# Patient Record
Sex: Female | Born: 1975 | Race: White | Hispanic: No | Marital: Married | Smoking: Former smoker
Health system: Southern US, Community
[De-identification: ages and names within clinical notes are randomized; demographics above are authoritative.]

## PROBLEM LIST (undated history)

## (undated) DIAGNOSIS — B009 Herpesviral infection, unspecified: Secondary | ICD-10-CM

## (undated) DIAGNOSIS — D259 Leiomyoma of uterus, unspecified: Secondary | ICD-10-CM

## (undated) DIAGNOSIS — F32A Depression, unspecified: Secondary | ICD-10-CM

## (undated) DIAGNOSIS — Z889 Allergy status to unspecified drugs, medicaments and biological substances status: Secondary | ICD-10-CM

## (undated) DIAGNOSIS — F329 Major depressive disorder, single episode, unspecified: Secondary | ICD-10-CM

## (undated) DIAGNOSIS — F319 Bipolar disorder, unspecified: Secondary | ICD-10-CM

## (undated) DIAGNOSIS — N879 Dysplasia of cervix uteri, unspecified: Secondary | ICD-10-CM

## (undated) DIAGNOSIS — J40 Bronchitis, not specified as acute or chronic: Secondary | ICD-10-CM

## (undated) HISTORY — DX: Dysplasia of cervix uteri, unspecified: N87.9

## (undated) HISTORY — DX: Herpesviral infection, unspecified: B00.9

## (undated) HISTORY — DX: Leiomyoma of uterus, unspecified: D25.9

## (undated) HISTORY — PX: LEEP: SHX91

## (undated) HISTORY — PX: MYOMECTOMY: SHX85

---

## 2000-06-15 ENCOUNTER — Emergency Department (HOSPITAL_COMMUNITY): Admission: EM | Admit: 2000-06-15 | Discharge: 2000-06-15 | Payer: Self-pay | Admitting: Emergency Medicine

## 2002-08-31 ENCOUNTER — Other Ambulatory Visit: Admission: RE | Admit: 2002-08-31 | Discharge: 2002-08-31 | Payer: Self-pay | Admitting: Obstetrics and Gynecology

## 2002-09-10 ENCOUNTER — Emergency Department (HOSPITAL_COMMUNITY): Admission: EM | Admit: 2002-09-10 | Discharge: 2002-09-10 | Payer: Self-pay | Admitting: Emergency Medicine

## 2005-10-26 ENCOUNTER — Ambulatory Visit (HOSPITAL_COMMUNITY): Admission: RE | Admit: 2005-10-26 | Discharge: 2005-10-26 | Payer: Self-pay | Admitting: Family Medicine

## 2005-10-27 ENCOUNTER — Ambulatory Visit (HOSPITAL_COMMUNITY): Admission: RE | Admit: 2005-10-27 | Discharge: 2005-10-27 | Payer: Self-pay | Admitting: *Deleted

## 2005-10-27 ENCOUNTER — Other Ambulatory Visit: Admission: RE | Admit: 2005-10-27 | Discharge: 2005-10-27 | Payer: Self-pay | Admitting: Gynecologic Oncology

## 2005-10-27 ENCOUNTER — Ambulatory Visit: Admission: RE | Admit: 2005-10-27 | Discharge: 2005-10-27 | Payer: Self-pay | Admitting: Gynecologic Oncology

## 2005-10-27 ENCOUNTER — Encounter (INDEPENDENT_AMBULATORY_CARE_PROVIDER_SITE_OTHER): Payer: Self-pay | Admitting: Specialist

## 2005-12-16 ENCOUNTER — Ambulatory Visit: Admission: RE | Admit: 2005-12-16 | Discharge: 2005-12-16 | Payer: Self-pay | Admitting: Gynecologic Oncology

## 2008-12-20 ENCOUNTER — Encounter: Admission: RE | Admit: 2008-12-20 | Discharge: 2008-12-20 | Payer: Self-pay | Admitting: *Deleted

## 2008-12-20 ENCOUNTER — Encounter: Admission: RE | Admit: 2008-12-20 | Discharge: 2008-12-20 | Payer: Self-pay | Admitting: Family Medicine

## 2010-10-03 NOTE — Consult Note (Signed)
NAME:  Claudia Berger, Claudia Berger NO.:  [id number]   MEDICAL RECORD NO.:  [id number]          PATIENT TYPE:  OUT   LOCATION:  GYN                          FACILITY:  [facility]   PHYSICIAN:  Paola A. Duard Brady, MD    DATE OF BIRTH:  10-03-1975   DATE OF CONSULTATION:  12/16/2005  DATE OF DISCHARGE:                                   CONSULTATION   Claudia Berger is a 35 year old, gravida 0 who was referred to me based on a  large abdominal pelvic mass.  She went to Urgent Care, a urine culture was  obtained when she had pain for about 3 months. The symptoms did not improve  and she subsequently underwent a CT scan.  CT scan revealed a large mixed  solid and cystic pelvic mass measuring 19.5 x 11.7 x 17.6 cm.  It was felt  to be most likely ovarian in etiology. On November 12, 2005, she underwent  exploratory laparotomy at [facility]. The operative findings included a  15 cm broad ligament and uterine myxoid cystic mass. 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 She also underwent a D&C.  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 There was no significant atypia or tumor cell  necrosis. The tumor was ER/PR positive. The endometrial curettings were  negative.  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  Close follow-up was recommended.  She comes in today for her postoperative  check.  She does have her first period since the procedure. She had slightly  increasing cramping compared to presurgery. The blood was a little darker  but otherwise she is doing well.  She wants to start working out again, is  going back to work next Wednesday and is feeling well.   PHYSICAL EXAMINATION:  VITAL SIGNS:  Weight is 116 pounds.  GENERAL:  Well-nourished, well-developed female in no acute distress.  ABDOMEN:  Shows a well-healed vertical skin incision.  Abdomen is soft,  nontender,  nondistended.  There are no palpable masses or  hepatosplenomegaly.  PELVIC:  External genitalia is within normal limits. On bimanual  examination, the corpus is normal size, shape and consistency, there are no  adnexal masses.   ASSESSMENT:  29.  A 35 year old status post essentially a myomectomy for a large      mitotically active myoma who is doing well postoperatively.  She      understands that this is not a malignant process, but it could recur.      She also understands the possibility of additional fibroids in the      future; however, at this time I would not recommend any more than just      routine GYN care.  She is going to be seeking GYN providers with [facility] and she was given records to take to them and she has asked      that we forward her note from today to them. She knows she does not need  GYN oncology follow-up but we will be happy to see her in the future      should the need arise.  2.  I also discussed with her that based on the myomectomy and the depth      that we were into the endomyometrium that she will require cesarean      section should she opt to having children in the future.  Her questions      were elicited and answered to her satisfaction and she will return on s      p.r.n. basis.      Paola A. Duard Brady, MD  Electronically Signed     PAG/MEDQ  D:  12/16/2005  T:  12/16/2005  Job:  [id number]

## 2010-10-03 NOTE — Consult Note (Signed)
NAME:  HAELYN, FORGEY NO.:  [id number]   MEDICAL RECORD NO.:  [id number]          PATIENT TYPE:  OUT   LOCATION:  CATS                          FACILITY:  [facility]   PHYSICIAN:  Paola A. Duard Brady, MD    DATE OF BIRTH:  1976/03/28   DATE OF CONSULTATION:  10/27/2005  DATE OF DISCHARGE:                                   CONSULTATION   REFERRING PHYSICIAN:  Pam Drown, M.D.,  [facility],  [facility].   The patient is seen today in consultation at the request of Dr. Corliss Blacker.  Ms. Voit is a very pleasant 35 year old gravida 0 whose last cycle was  June 1 of the year 2007, and her cycles have been regular. Approximately 3  months ago, she had an episode of some right-sided sharp pain. It lasted for  about three to four days.  She went to an Urgent Care. A urine culture was  performed that was negative.  She began complaining of increasing frequency  and urgency, no change in her bowel or bladder habits otherwise, and she was  seen by her primary physician.  She an ultrasound yesterday that showed  questionable right hydronephrosis as well as a pelvic mass.  She  subsequently was referred for a CT scan which she underwent today. Despite  multiple attempts, we have not been able to get the reading on that.  She  states that she does feel like she has gained weight.  Her clothes are  fitting tighter than they have been. She denies any chest pain, shortness of  breath, nausea, vomiting, fevers, chills, headaches or visual changes.   PAST MEDICAL HISTORY:  Significant for cervical dysplasia, anxiety disorder  with depression, and questionable bipolar disease.   PAST SURGICAL HISTORY:  Wisdom teeth extraction and LEEP.   MEDICATIONS:  None.   ALLERGIES:  PENICILLIN which causes hives and SULFA which causes hives.   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 She is single.   FAMILY HISTORY:  This is  significant for diabetes in her father and maternal  grandfather and maternal grandmother. Her father had colon cancer at age of  95.  She is an only child.   PAST GYN HISTORY:  She underwent a LEEP in September 2006.  She has not had  a followup Pap smear since that time.  She is sexually active and denies any  STDs.  She had 15 lifetime partners.   PHYSICAL EXAMINATION:  VITAL SIGNS: Weight 123 pounds, height 5 feet 3  inches. Blood pressure 118/68.  GENERAL: Well-nourished, well-developed female in no acute distress.  NECK:  Supple.  There is no lymphadenopathy, no thyromegaly.  LUNGS: Were clear to auscultation bilaterally.  CARDIOVASCULAR:  Regular rate and rhythm.  ABDOMEN:  She has an umbilical ring.  There is a palpable mass four  fingerbreadths above the umbilicus.  Abdomen is soft, nontender,  nondistended.  Palpable masses above.  Groins are negative for adenopathy.  EXTREMITIES:  No edema.  PELVIC:  External genitalia is within normal limits. The vagina  is well  epithelialized. The cervix is severely deviated over to her left side.  The  anterior lip of the cervix is visualized and is normal in appearance.  Thin  prep Pap was submitted without difficulty.  Bimanual examination: She has a  large abdominal pelvic mass measuring approximately 25 cm.  It is mobile.  The rectal mucosa is smooth.  I cannot separate or delineate which ovary it  is coming from or delineate the size of the corpus.   ASSESSMENT:  35 year old with history of cervical dysplasia who has got a  large abdominal pelvic mass.  Per her description of the ultrasound report,  it appears that it is a simple cyst.  However, we will need to follow up on  the results of her CT scan.  I discussed with her that, should this be  completely cystic, we may be able to consider laparoscopic or minimally  invasive surgery.  Otherwise, she will require a laparotomy.  Will obtain CA-  125, HCG, LDH, AFP, tumor markers  today.  The patient is scheduled for  surgery at [facility] on June 28.  Risks and benefits of surgery were  discussed with her. She understands that all attempts will be made to  proceed with fertility-sparing surgery.  We will call her on her cell phone  at [phone].  Of note, her mother does not know that she has undergone a  LEEP or had cervical dysplasia in the past. Her mother also does not know  that the patient smokes, and she would like to keep these issues  confidential.  She has my card. Her questions were answered and elicited to  her satisfaction.      Paola A. Duard Brady, MD  Electronically Signed     PAG/MEDQ  D:  10/27/2005  T:  10/27/2005  Job:  [id number]

## 2013-05-31 ENCOUNTER — Other Ambulatory Visit: Payer: Self-pay | Admitting: Family Medicine

## 2014-05-18 DIAGNOSIS — J40 Bronchitis, not specified as acute or chronic: Secondary | ICD-10-CM

## 2014-05-18 HISTORY — DX: Bronchitis, not specified as acute or chronic: J40

## 2014-06-01 ENCOUNTER — Encounter: Payer: Self-pay | Admitting: Gynecologic Oncology

## 2014-06-04 ENCOUNTER — Encounter: Payer: Self-pay | Admitting: Gynecologic Oncology

## 2014-06-04 ENCOUNTER — Ambulatory Visit: Payer: BLUE CROSS/BLUE SHIELD | Attending: Gynecologic Oncology | Admitting: Gynecologic Oncology

## 2014-06-04 VITALS — BP 138/87 | HR 86 | Temp 98.9°F | Resp 20 | Ht 63.0 in | Wt 141.8 lb

## 2014-06-04 DIAGNOSIS — N9489 Other specified conditions associated with female genital organs and menstrual cycle: Secondary | ICD-10-CM

## 2014-06-04 DIAGNOSIS — B009 Herpesviral infection, unspecified: Secondary | ICD-10-CM | POA: Insufficient documentation

## 2014-06-04 DIAGNOSIS — Z87891 Personal history of nicotine dependence: Secondary | ICD-10-CM | POA: Diagnosis not present

## 2014-06-04 DIAGNOSIS — N879 Dysplasia of cervix uteri, unspecified: Secondary | ICD-10-CM | POA: Diagnosis not present

## 2014-06-04 DIAGNOSIS — D259 Leiomyoma of uterus, unspecified: Secondary | ICD-10-CM | POA: Diagnosis not present

## 2014-06-04 DIAGNOSIS — D390 Neoplasm of uncertain behavior of uterus: Secondary | ICD-10-CM | POA: Insufficient documentation

## 2014-06-04 DIAGNOSIS — Z8742 Personal history of other diseases of the female genital tract: Secondary | ICD-10-CM

## 2014-06-04 DIAGNOSIS — N858 Other specified noninflammatory disorders of uterus: Secondary | ICD-10-CM | POA: Insufficient documentation

## 2014-06-04 NOTE — Patient Instructions (Addendum)
Preparing for your Surgery  Plan for surgery on Feb 9 with Dr. Denman George.  Pre-operative Testing -You will receive a phone call from presurgical testing at [facility] to arrange for a pre-operative testing appointment before your surgery.  This appointment normally occurs one to two weeks before your scheduled surgery.   -Bring your insurance card, copy of an advanced directive if applicable, medication list  -At that visit, you will be asked to sign a consent for a possible blood transfusion in case a transfusion becomes necessary during surgery.  The need for a blood transfusion is rare but having consent is a necessary part of your care.     -You should not be taking blood thinners or aspirin at least ten days prior to surgery unless instructed by your surgeon.  Day Before Surgery at [facility] will be asked to take in only clear liquids the day before surgery.  Examples of clear liquids include broths, jello, and clear juices.  You will be advised to have nothing to eat or drink after midnight the evening before.    Your role in recovery Your role is to become active as soon as directed by your doctor, while still giving yourself time to heal.  Rest when you feel tired. You will be asked to do the following in order to speed your recovery:  - Cough and breathe deeply. This helps toclear and expand your lungs and can prevent pneumonia. You may be given a spirometer to practice deep breathing. A staff member will show you how to use the spirometer. - Do mild physical activity. Walking or moving your legs help your circulation and body functions return to normal. A staff member will help you when you try to walk and will provide you with simple exercises. Do not try to get up or walk alone the first time. - Actively manage your pain. Managing your pain lets you move in comfort. We will ask you to rate your pain on a scale of zero to 10. It is your responsibility to tell your  doctor or nurse where and how much you hurt so your pain can be treated.  Special Considerations -If you are diabetic, you may be placed on insulin after surgery to have closer control over your blood sugars to promote healing and recovery.  This does not mean that you will be discharged on insulin.  If applicable, your oral antidiabetics will be resumed when you are tolerating a solid diet.  -Your final pathology results from surgery should be available by the Friday after surgery and the results will be relayed to you when available.  Blood Transfusion Information WHAT IS A BLOOD TRANSFUSION? A transfusion is the replacement of blood or some of its parts. Blood is made up of multiple cells which provide different functions.  Red blood cells carry oxygen and are used for blood loss replacement.  White blood cells fight against infection.  Platelets control bleeding.  Plasma helps clot blood.  Other blood products are available for specialized needs, such as hemophilia or other clotting disorders. BEFORE THE TRANSFUSION  Who gives blood for transfusions?   You may be able to donate blood to be used at a later date on yourself (autologous donation).  Relatives can be asked to donate blood. This is generally not any safer than if you have received blood from a stranger. The same precautions are taken to ensure safety when a relative's blood is donated.  Healthy volunteers who are fully  evaluated to make sure their blood is safe. This is blood bank blood. Transfusion therapy is the safest it has ever been in the practice of medicine. Before blood is taken from a donor, a complete history is taken to make sure that person has no history of diseases nor engages in risky social behavior (examples are intravenous drug use or sexual activity with multiple partners). The donor's travel history is screened to minimize risk of transmitting infections, such as malaria. The donated blood is tested for  signs of infectious diseases, such as HIV and hepatitis. The blood is then tested to be sure it is compatible with you in order to minimize the chance of a transfusion reaction. If you or a relative donates blood, this is often done in anticipation of surgery and is not appropriate for emergency situations. It takes many days to process the donated blood. RISKS AND COMPLICATIONS Although transfusion therapy is very safe and saves many lives, the main dangers of transfusion include:   Getting an infectious disease.  Developing a transfusion reaction. This is an allergic reaction to something in the blood you were given. Every precaution is taken to prevent this. The decision to have a blood transfusion has been considered carefully by your caregiver before blood is given. Blood is not given unless the benefits outweigh the risks.

## 2014-06-04 NOTE — Progress Notes (Signed)
Consult Note: Gyn-Onc  Consult was requested by Dr. Jobe Igo for the evaluation of Claudia Berger 39 y.o. female with a uterine mass  CC:  Chief Complaint  Patient presents with  . uterine mass    Assessment/Plan:  Ms. Claudia Berger  is a 39 y.o.  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 The diagnosis of mitotically active myoma versus sarcoma can only be made with definitive pathology, and not on imaging alone and the patient understands this potential diagnosis. It is for this reason that I am recommending surgical excision with an exploratory laparotomy myomectomy and possible hysterectomy. The ovaries appear normal and uninvolved on imaging. I discussed that even if sarcoma is identified ovarian preservation is reasonable. I discussed that frozen section we performed of the mass is sarcoma is identified at the time of surgery I will perform a definitive total hysterectomy. However, because the patient desires the potential for future childbearing, if frozen section reveals a more benign process, we will limit the surgery to myomectomy. I recommended that she undergo completion hysterectomy when she has determined that her childbearing is complete. I discussed that she would require cesarean section for the delivery of any children she should conceive. I discussed that frozen section carries with it diagnostic area, particularly in cases such as this where a spectrum diagnosis between benign and malignant is challenging. In such a case it may be necessary to perform a second surgery (completion hysterectomy) if there is a discrepancy between intraoperative frozen section revealing benign pathology and definitive pathology confirming malignancy.  I discussed operative risks including  bleeding, infection, damage to internal organs (such as bladder,ureters, bowels), blood clot, reoperation and rehospitalization. I discussed that these  are higher for patients recorded who have undergone a prior major laparotomy in the past. I discussed anticipated postoperative recovery and hospital stay.  We will assess LDH preoperatively as a potential tumor marker for leiomyosarcoma but I discussed the nonspecific nature of this test to the patient.   HPI: Claudia Berger is a 39 year old gravida 0 who is seen in consultation at the request of Dr. Jobe Igo for a large right uterine fundal mass. 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 Nancy Marus on 11/12/2005. At that time the mass was erupting from the broad ligament and she received a myomectomy to excise it. The tumor was ER/PR positive endometrial curettings were negative. It was felt that it represented a benign process and, while recurrence was certainly possible, she elected for close follow-up with no completion hysterectomy as she desired the potential for future childbearing.  She been doing well and was asymptomatic until she began experiencing low back pain approximately one month ago in November 2011 she had an annual gynecologic exam which included a normal Pap smear (with negative HPV) and a transvaginal ultrasound to monitor her known uterine fibroids. The transvaginal ultrasound revealed a 13 x 12 x 8 cm pliable mass arising from the uterine fundus with cystic and solid components and positive Doppler flow within. Both ovaries were identified separate from the mass and appeared normal. Because of the concerning features on ultrasound she underwent an MRI of the pelvis with and without contrast on 05/15/2014 at [facility]. This confirmed 14.6 x 9.1 x 11.9 cm large complex solid and  cystic mass immediately subadjacent and contiguous with the right superior uterine fundus anteriorly, extending superiorly to the level of the umbilicus, with cystic components tracking  posterior to the uterus in the cul-de-sac. The uterus itself appeared normal at 5.7 x 4.1 x 3.2 cm there was no evidence of metastatic lesions with no enlarged retroperitoneal lymph nodes, and no peritoneal disease.  Interval History: persistent mild back pain  Current Meds:  Outpatient Encounter Prescriptions as of 06/04/2014  Medication Sig  . ARIPiprazole (ABILIFY) 15 MG tablet Take 15 mg by mouth daily.  Marland Kitchen levocetirizine (XYZAL) 5 MG tablet Take 5 mg by mouth.  . Norethin Ace-Eth Estrad-FE (MINASTRIN 24 FE PO) Take 1 tablet by mouth daily.  . valACYclovir (VALTREX) 500 MG tablet Take 500 mg by mouth daily.  . [DISCONTINUED] nystatin-triamcinolone (MYCOLOG II) cream Apply 1 application topically 2 (two) times daily.    Allergy:  Allergies  Allergen Reactions  . Penicillins Rash  . Sulfa Antibiotics Rash    Social Hx:   History   Social History  . Marital Status: Married    Spouse Name: N/A    Number of Children: N/A  . Years of Education: N/A   Occupational History  . Not on file.   Social History Main Topics  . Smoking status: Former Research scientist (life sciences)  . Smokeless tobacco: Not on file  . Alcohol Use: Yes     Comment: socially  . Drug Use: No  . Sexual Activity: Not on file   Other Topics Concern  . Not on file   Social History Narrative    Past Surgical Hx:  Past Surgical History  Procedure Laterality Date  . Leep    . Myomectomy      Past Medical Hx:  Past Medical History  Diagnosis Date  . Cervical dysplasia   . Herpes simplex   . Leiomyoma of uterus     Past Gynecological History:  Cervical dysplasia, LEEP in2010. Normal pap and neg hrHPV 11/15. G0.  No LMP recorded.  Family Hx:  Family History  Problem Relation Age of Onset  . Colon cancer Father 71  . Alzheimer's disease Maternal Grandfather   . Alzheimer's disease Maternal Grandmother   . Diabetes Father     Review of Systems:  Constitutional  Feels well,    ENT Normal appearing ears and  nares bilaterally Skin/Breast  No rash, sores, jaundice, itching, dryness Cardiovascular  No chest pain, shortness of breath, or edema  Pulmonary  No cough or wheeze.  Gastro Intestinal  No nausea, vomitting, or diarrhoea. No bright red blood per rectum, no abdominal pain, change in bowel movement, or constipation.  Genito Urinary  No frequency, urgency, dysuria, see HPI Musculo Skeletal  No myalgia, arthralgia, joint swelling, + back pain  Neurologic  No weakness, numbness, change in gait,  Psychology  No depression, anxiety, insomnia.   Vitals:  Blood pressure 138/87, pulse 86, temperature 98.9 F (37.2 C), temperature source Oral, resp. rate 20, height 5\' 3"  (1.6 m), weight 141 lb 12.8 oz (64.32 kg).  Physical Exam: WD in NAD Neck  Supple NROM, without any enlargements.  Lymph Node Survey No cervical supraclavicular or inguinal adenopathy Cardiovascular  Pulse normal rate, regularity and rhythm. S1 and S2 normal.  Lungs  Clear to auscultation bilateraly, without wheezes/crackles/rhonchi. Good air movement.  Skin  No rash/lesions/breakdown  Psychiatry  Alert and oriented to person, place, and time  Abdomen  Normoactive bowel sounds, abdomen soft, non-tender and thin without evidence of hernia.  Back No CVA tenderness Genito Urinary  Vulva/vagina: Normal external female genitalia.  No lesions. No discharge or bleeding.  Bladder/urethra:  No lesions or masses, well supported bladder  Vagina: normal  Cervix: Normal appearing, no lesions.  Uterus: mass extending to umbilicus, soft, mobile, contiguous with small, mobile uterus. No parametrial involvement or nodularity.  Adnexa: central pelvic mass. Rectal  Good tone, no masses no cul de sac nodularity.  Extremities  No bilateral cyanosis, clubbing or edema.   Donaciano Eva, MD   06/04/2014, 2:53 PM

## 2014-06-14 ENCOUNTER — Ambulatory Visit: Payer: Self-pay | Admitting: Gynecologic Oncology

## 2014-06-19 NOTE — Patient Instructions (Addendum)
Your procedure is scheduled on:06/26/14  TUESDAY    Report to [facility] at    1130am   Call this number if you have problems the morning of surgery: [phone]        Do not eat food  :After Midnight. Monday NIGHT--- OR AS DIRECTED BY OFFICE.  MAY HAVE CLEAR LIQUIDS Tuesday MORNING UNTIL 0800am-  THEN NOTHING   Take these medicines the morning of surgery with A SIP OF WATER:Abilify, Xyzal   .  Contacts, dentures or partial plates, or metal hairpins  can not be worn to surgery. Your family will be responsible for glasses, dentures, hearing aides while you are in surgery  Leave suitcase in the car. After surgery it may be brought to your room.  For patients admitted to the hospital, checkout time is 11:00 AM day of  discharge.         [facility] IS NOT RESPONSIBLE FOR ANY VALUABLES  Patients discharged the day of surgery will not be allowed to drive home. IF going home the day of surgery, you must have a driver and someone to stay with you for the first 24 hours                                                                  [facility] - Preparing for Surgery Before surgery, you can play an important role.  Because skin is not sterile, your skin needs to be as free of germs as possible.  You can reduce the number of germs on your skin by washing with CHG (chlorahexidine gluconate) soap before surgery.  CHG is an antiseptic cleaner which kills germs and bonds with the skin to continue killing germs even after washing. Please DO NOT use if you have an allergy to CHG or antibacterial soaps.  If your skin becomes reddened/irritated stop using the CHG and inform your nurse when you arrive at Short Stay. Do not shave (including legs and underarms) for at least 48 hours prior to the first CHG shower.  You may shave your face/neck. Please follow these instructions carefully:  1.  Shower with CHG Soap the night  before surgery and the  morning of Surgery.  2.  If you choose to wash your hair, wash your hair first as usual with your  normal  shampoo.  3.  After you shampoo, rinse your hair and body thoroughly to remove the  shampoo.                           4.  Use CHG as you would any other liquid soap.  You can apply chg directly  to the skin and wash                       Gently with a scrungie or clean washcloth.  5.  Apply the CHG Soap to your body ONLY FROM THE NECK DOWN.   Do not use on face/ open                           Wound  or open sores. Avoid contact with eyes, ears mouth and genitals (private parts).                       Wash face,  Genitals (private parts) with your normal soap.             6.  Wash thoroughly, paying special attention to the area where your surgery  will be performed.  7.  Thoroughly rinse your body with warm water from the neck down.  8.  DO NOT shower/wash with your normal soap after using and rinsing off  the CHG Soap.                9.  Pat yourself dry with a clean towel.            10.  Wear clean pajamas.            11.  Place clean sheets on your bed the night of your first shower and do not  sleep with pets. Day of Surgery : Do not apply any lotions/deodorants the morning of surgery.  Please wear clean clothes to the hospital/surgery center.  FAILURE TO FOLLOW THESE INSTRUCTIONS MAY RESULT IN THE CANCELLATION OF YOUR SURGERY PATIENT SIGNATURE_________________________________  NURSE SIGNATURE__________________________________  ________________________________________________________________________    CLEAR LIQUID DIET   Foods Allowed                                                                     Foods Excluded  Coffee and tea, regular and decaf                             liquids that you cannot  Plain Jell-O in any flavor                                             see through such as: Fruit ices (not with fruit pulp)                                      milk, soups, orange juice  Iced Popsicles                                    All solid food Carbonated beverages, regular and diet                                    Cranberry, grape and apple juices Sports drinks like Gatorade Lightly seasoned clear broth or consume(fat free) Sugar, honey syrup  Sample Menu Breakfast                                Lunch  Supper Cranberry juice                    Beef broth                            Chicken broth Jell-O                                     Grape juice                           Apple juice Coffee or tea                        Jell-O                                      Popsicle                                                Coffee or tea                        Coffee or tea  _____________________________________________________________________    Incentive Spirometer  An incentive spirometer is a tool that can help keep your lungs clear and active. This tool measures how well you are filling your lungs with each breath. Taking long deep breaths may help reverse or decrease the chance of developing breathing (pulmonary) problems (especially infection) following:  A long period of time when you are unable to move or be active. BEFORE THE PROCEDURE   If the spirometer includes an indicator to show your best effort, your nurse or respiratory therapist will set it to a desired goal.  If possible, sit up straight or lean slightly forward. Try not to slouch.  Hold the incentive spirometer in an upright position. INSTRUCTIONS FOR USE   Sit on the edge of your bed if possible, or sit up as far as you can in bed or on a chair.  Hold the incentive spirometer in an upright position.  Breathe out normally.  Place the mouthpiece in your mouth and seal your lips tightly around it.  Breathe in slowly and as deeply as possible, raising the piston or the ball toward the top of the column.  Hold  your breath for 3-5 seconds or for as long as possible. Allow the piston or ball to fall to the bottom of the column.  Remove the mouthpiece from your mouth and breathe out normally.  Rest for a few seconds and repeat Steps 1 through 7 at least 10 times every 1-2 hours when you are awake. Take your time and take a few normal breaths between deep breaths.  The spirometer may include an indicator to show your best effort. Use the indicator as a goal to work toward during each repetition.  After each set of 10 deep breaths, practice coughing to be sure your lungs are clear. If you have an incision (the cut made at the time of surgery), support your incision when coughing by placing a pillow or rolled up towels firmly against  it. Once you are able to get out of bed, walk around indoors and cough well. You may stop using the incentive spirometer when instructed by your caregiver.  RISKS AND COMPLICATIONS  Take your time so you do not get dizzy or light-headed.  If you are in pain, you may need to take or ask for pain medication before doing incentive spirometry. It is harder to take a deep breath if you are having pain. AFTER USE  Rest and breathe slowly and easily.  It can be helpful to keep track of a log of your progress. Your caregiver can provide you with a simple table to help with this. If you are using the spirometer at home, follow these instructions: [place] IF:   You are having difficultly using the spirometer.  You have trouble using the spirometer as often as instructed.  Your pain medication is not giving enough relief while using the spirometer.  You develop fever of 100.5 F (38.1 C) or higher. SEEK IMMEDIATE MEDICAL CARE IF:   You cough up bloody sputum that had not been present before.  You develop fever of 102 F (38.9 C) or greater.  You develop worsening pain at or near the incision site. MAKE SURE YOU:   Understand these instructions.  Will watch  your condition.  Will get help right away if you are not doing well or get worse. Document Released: 09/14/2006 Document Revised: 07/27/2011 Document Reviewed: 11/15/2006 [employer] Patient Information 2014 [employer], [place].   ________________________________________________________________________  WHAT IS A BLOOD TRANSFUSION? Blood Transfusion Information  A transfusion is the replacement of blood or some of its parts. Blood is made up of multiple cells which provide different functions.  Red blood cells carry oxygen and are used for blood loss replacement.  White blood cells fight against infection.  Platelets control bleeding.  Plasma helps clot blood.  Other blood products are available for specialized needs, such as hemophilia or other clotting disorders. BEFORE THE TRANSFUSION  Who gives blood for transfusions?   Healthy volunteers who are fully evaluated to make sure their blood is safe. This is blood bank blood. Transfusion therapy is the safest it has ever been in the practice of medicine. Before blood is taken from a donor, a complete history is taken to make sure that person has no history of diseases nor engages in risky social behavior (examples are intravenous drug use or sexual activity with multiple partners). The donor's travel history is screened to minimize risk of transmitting infections, such as malaria. The donated blood is tested for signs of infectious diseases, such as HIV and hepatitis. The blood is then tested to be sure it is compatible with you in order to minimize the chance of a transfusion reaction. If you or a relative donates blood, this is often done in anticipation of surgery and is not appropriate for emergency situations. It takes many days to process the donated blood. RISKS AND COMPLICATIONS Although transfusion therapy is very safe and saves many lives, the main dangers of transfusion include:   Getting an infectious disease.  Developing a  transfusion reaction. This is an allergic reaction to something in the blood you were given. Every precaution is taken to prevent this. The decision to have a blood transfusion has been considered carefully by your caregiver before blood is given. Blood is not given unless the benefits outweigh the risks. AFTER THE TRANSFUSION  Right after receiving a blood transfusion, you will usually feel much better and more  energetic. This is especially true if your red blood cells have gotten low (anemic). The transfusion raises the level of the red blood cells which carry oxygen, and this usually causes an energy increase.  The nurse administering the transfusion will monitor you carefully for complications. HOME CARE INSTRUCTIONS  No special instructions are needed after a transfusion. You may find your energy is better. Speak with your caregiver about any limitations on activity for underlying diseases you may have. SEEK MEDICAL CARE IF:   Your condition is not improving after your transfusion.  You develop redness or irritation at the intravenous (IV) site. SEEK IMMEDIATE MEDICAL CARE IF:  Any of the following symptoms occur over the next 12 hours:  Shaking chills.  You have a temperature by mouth above 102 F (38.9 C), not controlled by medicine.  Chest, back, or muscle pain.  People around you feel you are not acting correctly or are confused.  Shortness of breath or difficulty breathing.  Dizziness and fainting.  You get a rash or develop hives.  You have a decrease in urine output.  Your urine turns a dark color or changes to pink, red, or brown. Any of the following symptoms occur over the next 10 days:  You have a temperature by mouth above 102 F (38.9 C), not controlled by medicine.  Shortness of breath.  Weakness after normal activity.  The white part of the eye turns yellow (jaundice).  You have a decrease in the amount of urine or are urinating less often.  Your  urine turns a dark color or changes to pink, red, or brown. Document Released: 05/01/2000 Document Revised: 07/27/2011 Document Reviewed: 12/19/2007 [facility] Patient Information 2014 [place], [place].  _______________________________________________________________________

## 2014-06-20 ENCOUNTER — Encounter (HOSPITAL_COMMUNITY): Payer: Self-pay

## 2014-06-20 ENCOUNTER — Encounter (HOSPITAL_COMMUNITY)
Admission: RE | Admit: 2014-06-20 | Discharge: 2014-06-20 | Disposition: A | Discharge status: Home / Self Care | Payer: BLUE CROSS/BLUE SHIELD | Source: Ambulatory Visit | Attending: Gynecologic Oncology | Admitting: Gynecologic Oncology

## 2014-06-20 DIAGNOSIS — N9489 Other specified conditions associated with female genital organs and menstrual cycle: Secondary | ICD-10-CM | POA: Diagnosis not present

## 2014-06-20 DIAGNOSIS — Z01818 Encounter for other preprocedural examination: Secondary | ICD-10-CM | POA: Diagnosis not present

## 2014-06-20 HISTORY — DX: Bipolar disorder, unspecified: F31.9

## 2014-06-20 HISTORY — DX: Depression, unspecified: F32.A

## 2014-06-20 HISTORY — DX: Bronchitis, not specified as acute or chronic: J40

## 2014-06-20 HISTORY — DX: Major depressive disorder, single episode, unspecified: F32.9

## 2014-06-20 HISTORY — DX: Allergy status to unspecified drugs, medicaments and biological substances: Z88.9

## 2014-06-20 LAB — CBC WITH DIFFERENTIAL/PLATELET
BASOS PCT: 0 % (ref 0–1)
Basophils Absolute: 0 10*3/uL (ref 0.0–0.1)
EOS ABS: 0.3 10*3/uL (ref 0.0–0.7)
EOS PCT: 4 % (ref 0–5)
HEMATOCRIT: 42.6 % (ref 36.0–46.0)
Hemoglobin: 14.1 g/dL (ref 12.0–15.0)
Lymphocytes Relative: 32 % (ref 12–46)
Lymphs Abs: 2.3 10*3/uL (ref 0.7–4.0)
MCH: 30.9 pg (ref 26.0–34.0)
MCHC: 33.1 g/dL (ref 30.0–36.0)
MCV: 93.4 fL (ref 78.0–100.0)
MONO ABS: 0.5 10*3/uL (ref 0.1–1.0)
Monocytes Relative: 7 % (ref 3–12)
Neutro Abs: 4 10*3/uL (ref 1.7–7.7)
Neutrophils Relative %: 57 % (ref 43–77)
Platelets: 301 10*3/uL (ref 150–400)
RBC: 4.56 MIL/uL (ref 3.87–5.11)
RDW: 12.8 % (ref 11.5–15.5)
WBC: 7.2 10*3/uL (ref 4.0–10.5)

## 2014-06-20 LAB — COMPREHENSIVE METABOLIC PANEL
ALK PHOS: 38 U/L — AB (ref 39–117)
ALT: 30 U/L (ref 0–35)
AST: 29 U/L (ref 0–37)
Albumin: 4.2 g/dL (ref 3.5–5.2)
Anion gap: 7 (ref 5–15)
BUN: 19 mg/dL (ref 6–23)
CO2: 23 mmol/L (ref 19–32)
Calcium: 9.1 mg/dL (ref 8.4–10.5)
Chloride: 108 mmol/L (ref 96–112)
Creatinine, Ser: 0.83 mg/dL (ref 0.50–1.10)
GFR calc Af Amer: >90 mL/min (ref >90)
GFR, EST NON AFRICAN AMERICAN: 90 mL/min — AB (ref >90)
Glucose, Bld: 93 mg/dL (ref 70–99)
Potassium: 4.5 mmol/L (ref 3.5–5.1)
Sodium: 138 mmol/L (ref 135–145)
Total Bilirubin: 0.6 mg/dL (ref 0.3–1.2)
Total Protein: 7.4 g/dL (ref 6.0–8.3)

## 2014-06-20 LAB — URINALYSIS, ROUTINE W REFLEX MICROSCOPIC
Bilirubin Urine: NEGATIVE
Glucose, UA: NEGATIVE mg/dL
HGB URINE DIPSTICK: NEGATIVE
Ketones, ur: NEGATIVE mg/dL
Leukocytes, UA: NEGATIVE
Nitrite: NEGATIVE
Protein, ur: NEGATIVE mg/dL
Specific Gravity, Urine: 1.008 (ref 1.005–1.030)
UROBILINOGEN UA: 0.2 mg/dL (ref 0.0–1.0)
pH: 7 (ref 5.0–8.0)

## 2014-06-20 LAB — ABO/RH: ABO/RH(D): A POS

## 2014-06-20 LAB — PREGNANCY, URINE: PREG TEST UR: NEGATIVE

## 2014-06-25 ENCOUNTER — Telehealth: Payer: Self-pay | Admitting: Nurse Practitioner

## 2014-06-25 NOTE — Telephone Encounter (Signed)
Patient returned call from gyn onc RN; patient confirmed she has been on clear liquid diet today and knows to stop all intake by 0800n tomorrow. She denies other questions or concerns at this time and thanks for the call.

## 2014-06-25 NOTE — Telephone Encounter (Signed)
Patient with nonidentifying voicemail, non-specific message left for patient to return call to [facility] at [phone] to confirm that she is on clear liquid diet today and until 0800 tomorrow in preparation for her pm surgery on 06/26/14.

## 2014-06-26 ENCOUNTER — Encounter (HOSPITAL_COMMUNITY)
Admission: RE | Disposition: A | Discharge status: Home / Self Care | Payer: Self-pay | Source: Ambulatory Visit | Attending: Obstetrics & Gynecology

## 2014-06-26 ENCOUNTER — Inpatient Hospital Stay (HOSPITAL_COMMUNITY): Payer: BLUE CROSS/BLUE SHIELD | Admitting: Certified Registered Nurse Anesthetist

## 2014-06-26 ENCOUNTER — Encounter (HOSPITAL_COMMUNITY): Payer: Self-pay | Admitting: *Deleted

## 2014-06-26 ENCOUNTER — Inpatient Hospital Stay (HOSPITAL_COMMUNITY)
Admission: RE | Admit: 2014-06-26 | Discharge: 2014-06-27 | DRG: 742 | Disposition: A | Discharge status: Home / Self Care | Payer: BLUE CROSS/BLUE SHIELD | Source: Ambulatory Visit | Attending: Obstetrics & Gynecology | Admitting: Obstetrics & Gynecology

## 2014-06-26 DIAGNOSIS — D251 Intramural leiomyoma of uterus: Principal | ICD-10-CM | POA: Diagnosis present

## 2014-06-26 DIAGNOSIS — R188 Other ascites: Secondary | ICD-10-CM | POA: Diagnosis present

## 2014-06-26 DIAGNOSIS — R1900 Intra-abdominal and pelvic swelling, mass and lump, unspecified site: Secondary | ICD-10-CM | POA: Diagnosis present

## 2014-06-26 DIAGNOSIS — Z87891 Personal history of nicotine dependence: Secondary | ICD-10-CM | POA: Diagnosis not present

## 2014-06-26 DIAGNOSIS — Z87410 Personal history of cervical dysplasia: Secondary | ICD-10-CM

## 2014-06-26 DIAGNOSIS — Z01812 Encounter for preprocedural laboratory examination: Secondary | ICD-10-CM

## 2014-06-26 DIAGNOSIS — D259 Leiomyoma of uterus, unspecified: Secondary | ICD-10-CM

## 2014-06-26 DIAGNOSIS — N858 Other specified noninflammatory disorders of uterus: Secondary | ICD-10-CM | POA: Diagnosis present

## 2014-06-26 DIAGNOSIS — Z800 Family history of malignant neoplasm of digestive organs: Secondary | ICD-10-CM | POA: Diagnosis not present

## 2014-06-26 HISTORY — PX: LAPAROTOMY: SHX154

## 2014-06-26 SURGERY — LAPAROTOMY, EXPLORATORY
Anesthesia: General

## 2014-06-26 MED ORDER — LACTATED RINGERS IV SOLN
INTRAVENOUS | Status: DC
  Administered 2014-06-26: 1000 mL via INTRAVENOUS
  Administered 2014-06-26: 15:00:00 via INTRAVENOUS

## 2014-06-26 MED ORDER — PROMETHAZINE HCL 25 MG/ML IJ SOLN: 6.2500 mg | INTRAMUSCULAR | Status: DC | PRN

## 2014-06-26 MED ORDER — SODIUM CHLORIDE 0.9 % IJ SOLN
INTRAMUSCULAR | Status: AC
  Filled 2014-06-26: qty 50

## 2014-06-26 MED ORDER — BUPIVACAINE LIPOSOME 1.3 % IJ SUSP
20.0000 mL | Freq: Once | INTRAMUSCULAR | Status: AC
  Administered 2014-06-26: 20 mL
  Filled 2014-06-26: qty 20

## 2014-06-26 MED ORDER — LACTATED RINGERS IV SOLN: INTRAVENOUS | Status: DC

## 2014-06-26 MED ORDER — MIDAZOLAM HCL 2 MG/2ML IJ SOLN
INTRAMUSCULAR | Status: AC
  Filled 2014-06-26: qty 2

## 2014-06-26 MED ORDER — CISATRACURIUM BESYLATE (PF) 10 MG/5ML IV SOLN
INTRAVENOUS | Status: DC | PRN
  Administered 2014-06-26: 2 mg via INTRAVENOUS
  Administered 2014-06-26: 6 mg via INTRAVENOUS

## 2014-06-26 MED ORDER — HYDROMORPHONE HCL 1 MG/ML IJ SOLN
INTRAMUSCULAR | Status: AC
  Filled 2014-06-26: qty 1

## 2014-06-26 MED ORDER — CIPROFLOXACIN IN D5W 400 MG/200ML IV SOLN
INTRAVENOUS | Status: AC
  Filled 2014-06-26: qty 200

## 2014-06-26 MED ORDER — HYDROMORPHONE HCL 1 MG/ML IJ SOLN
0.2500 mg | INTRAMUSCULAR | Status: DC | PRN
  Administered 2014-06-26 (×6): 0.25 mg via INTRAVENOUS

## 2014-06-26 MED ORDER — ONDANSETRON HCL 4 MG/2ML IJ SOLN
INTRAMUSCULAR | Status: AC
  Filled 2014-06-26: qty 2

## 2014-06-26 MED ORDER — ONDANSETRON HCL 4 MG/2ML IJ SOLN: 4.0000 mg | Freq: Four times a day (QID) | INTRAMUSCULAR | Status: DC | PRN

## 2014-06-26 MED ORDER — SODIUM CHLORIDE 0.9 % IJ SOLN
INTRAMUSCULAR | Status: DC | PRN
  Administered 2014-06-26: 20 mL

## 2014-06-26 MED ORDER — MEPERIDINE HCL 50 MG/ML IJ SOLN: 6.2500 mg | INTRAMUSCULAR | Status: DC | PRN

## 2014-06-26 MED ORDER — MIDAZOLAM HCL 5 MG/5ML IJ SOLN
INTRAMUSCULAR | Status: DC | PRN
  Administered 2014-06-26 (×2): 1 mg via INTRAVENOUS

## 2014-06-26 MED ORDER — LORATADINE 10 MG PO TABS
10.0000 mg | ORAL_TABLET | Freq: Every day | ORAL | Status: DC
  Administered 2014-06-27: 10 mg via ORAL
  Filled 2014-06-26: qty 1

## 2014-06-26 MED ORDER — KETOROLAC TROMETHAMINE 30 MG/ML IJ SOLN
INTRAMUSCULAR | Status: AC
  Filled 2014-06-26: qty 1

## 2014-06-26 MED ORDER — SODIUM CHLORIDE 0.9 % IJ SOLN
INTRAMUSCULAR | Status: AC
  Filled 2014-06-26: qty 10

## 2014-06-26 MED ORDER — ENSURE COMPLETE PO LIQD
237.0000 mL | Freq: Two times a day (BID) | ORAL | Status: DC
  Administered 2014-06-26: 237 mL via ORAL

## 2014-06-26 MED ORDER — LIDOCAINE HCL (CARDIAC) 20 MG/ML IV SOLN
INTRAVENOUS | Status: AC
  Filled 2014-06-26: qty 5

## 2014-06-26 MED ORDER — EPHEDRINE SULFATE 50 MG/ML IJ SOLN
INTRAMUSCULAR | Status: AC
  Filled 2014-06-26: qty 1

## 2014-06-26 MED ORDER — FENTANYL CITRATE 0.05 MG/ML IJ SOLN
INTRAMUSCULAR | Status: AC
  Filled 2014-06-26: qty 5

## 2014-06-26 MED ORDER — ONDANSETRON HCL 4 MG/2ML IJ SOLN
INTRAMUSCULAR | Status: DC | PRN
  Administered 2014-06-26: 4 mg via INTRAVENOUS

## 2014-06-26 MED ORDER — FENTANYL CITRATE 0.05 MG/ML IJ SOLN
INTRAMUSCULAR | Status: DC | PRN
  Administered 2014-06-26 (×5): 50 ug via INTRAVENOUS

## 2014-06-26 MED ORDER — KETOROLAC TROMETHAMINE 30 MG/ML IJ SOLN
30.0000 mg | Freq: Four times a day (QID) | INTRAMUSCULAR | Status: AC
  Administered 2014-06-26 – 2014-06-27 (×3): 30 mg via INTRAVENOUS
  Filled 2014-06-26 (×3): qty 1

## 2014-06-26 MED ORDER — SUCCINYLCHOLINE CHLORIDE 20 MG/ML IJ SOLN
INTRAMUSCULAR | Status: DC | PRN
  Administered 2014-06-26: 100 mg via INTRAVENOUS

## 2014-06-26 MED ORDER — CLINDAMYCIN PHOSPHATE 900 MG/50ML IV SOLN
INTRAVENOUS | Status: AC
  Filled 2014-06-26: qty 50

## 2014-06-26 MED ORDER — PROPOFOL 10 MG/ML IV BOLUS
INTRAVENOUS | Status: DC | PRN
  Administered 2014-06-26: 175 mg via INTRAVENOUS

## 2014-06-26 MED ORDER — KETOROLAC TROMETHAMINE 30 MG/ML IJ SOLN
30.0000 mg | Freq: Four times a day (QID) | INTRAMUSCULAR | Status: AC
  Filled 2014-06-26: qty 1

## 2014-06-26 MED ORDER — VASOPRESSIN 20 UNIT/ML IV SOLN
INTRAVENOUS | Status: DC | PRN
  Administered 2014-06-26: 20 mL via INTRAMUSCULAR

## 2014-06-26 MED ORDER — DEXAMETHASONE SODIUM PHOSPHATE 10 MG/ML IJ SOLN
INTRAMUSCULAR | Status: AC
  Filled 2014-06-26: qty 1

## 2014-06-26 MED ORDER — OXYCODONE HCL 5 MG PO TABS
5.0000 mg | ORAL_TABLET | ORAL | Status: DC | PRN
  Administered 2014-06-26 – 2014-06-27 (×2): 5 mg via ORAL
  Filled 2014-06-26 (×2): qty 1

## 2014-06-26 MED ORDER — CLINDAMYCIN PHOSPHATE 900 MG/50ML IV SOLN
900.0000 mg | INTRAVENOUS | Status: AC
  Administered 2014-06-26: 900 mg via INTRAVENOUS

## 2014-06-26 MED ORDER — DEXAMETHASONE SODIUM PHOSPHATE 10 MG/ML IJ SOLN
INTRAMUSCULAR | Status: DC | PRN
  Administered 2014-06-26: 10 mg via INTRAVENOUS

## 2014-06-26 MED ORDER — PROPOFOL 10 MG/ML IV BOLUS
INTRAVENOUS | Status: AC
  Filled 2014-06-26: qty 20

## 2014-06-26 MED ORDER — KCL IN DEXTROSE-NACL 20-5-0.45 MEQ/L-%-% IV SOLN
INTRAVENOUS | Status: DC
  Administered 2014-06-26: 19:00:00 via INTRAVENOUS
  Filled 2014-06-26 (×2): qty 1000

## 2014-06-26 MED ORDER — VASOPRESSIN 20 UNIT/ML IV SOLN
INTRAVENOUS | Status: AC
  Filled 2014-06-26: qty 1

## 2014-06-26 MED ORDER — ARIPIPRAZOLE 2 MG PO TABS
2.0000 mg | ORAL_TABLET | Freq: Every morning | ORAL | Status: DC
  Administered 2014-06-27: 2 mg via ORAL
  Filled 2014-06-26: qty 1

## 2014-06-26 MED ORDER — ONDANSETRON HCL 4 MG PO TABS: 4.0000 mg | ORAL_TABLET | Freq: Four times a day (QID) | ORAL | Status: DC | PRN

## 2014-06-26 MED ORDER — LIDOCAINE HCL (CARDIAC) 20 MG/ML IV SOLN
INTRAVENOUS | Status: DC | PRN
  Administered 2014-06-26: 75 mg via INTRAVENOUS

## 2014-06-26 MED ORDER — KETOROLAC TROMETHAMINE 30 MG/ML IJ SOLN
30.0000 mg | Freq: Once | INTRAMUSCULAR | Status: AC
  Administered 2014-06-26: 30 mg via INTRAVENOUS

## 2014-06-26 MED ORDER — 0.9 % SODIUM CHLORIDE (POUR BTL) OPTIME
TOPICAL | Status: DC | PRN
  Administered 2014-06-26: 3000 mL

## 2014-06-26 MED ORDER — LEVOCETIRIZINE DIHYDROCHLORIDE 5 MG PO TABS: 5.0000 mg | ORAL_TABLET | Freq: Every morning | ORAL | Status: DC

## 2014-06-26 MED ORDER — HEMOSTATIC AGENTS (NO CHARGE) OPTIME
TOPICAL | Status: DC | PRN
  Administered 2014-06-26: 1 via TOPICAL

## 2014-06-26 MED ORDER — ENOXAPARIN SODIUM 40 MG/0.4ML ~~LOC~~ SOLN
40.0000 mg | SUBCUTANEOUS | Status: AC
  Administered 2014-06-26: 40 mg via SUBCUTANEOUS
  Filled 2014-06-26: qty 0.4

## 2014-06-26 MED ORDER — CIPROFLOXACIN IN D5W 400 MG/200ML IV SOLN
400.0000 mg | INTRAVENOUS | Status: AC
  Administered 2014-06-26: 400 mg via INTRAVENOUS

## 2014-06-26 MED ORDER — IBUPROFEN 800 MG PO TABS
800.0000 mg | ORAL_TABLET | Freq: Four times a day (QID) | ORAL | Status: DC
  Filled 2014-06-26 (×3): qty 1

## 2014-06-26 MED ORDER — GLYCOPYRROLATE 0.2 MG/ML IJ SOLN
INTRAMUSCULAR | Status: DC | PRN
  Administered 2014-06-26: 0.6 mg via INTRAVENOUS

## 2014-06-26 MED ORDER — NEOSTIGMINE METHYLSULFATE 10 MG/10ML IV SOLN
INTRAVENOUS | Status: DC | PRN
  Administered 2014-06-26: 5 mg via INTRAVENOUS

## 2014-06-26 MED ORDER — ACETAMINOPHEN 500 MG PO TABS
1000.0000 mg | ORAL_TABLET | Freq: Four times a day (QID) | ORAL | Status: DC
  Administered 2014-06-26 – 2014-06-27 (×3): 1000 mg via ORAL
  Filled 2014-06-26 (×6): qty 2

## 2014-06-26 SURGICAL SUPPLY — 61 items
ADH SKN CLS APL DERMABOND .7 (GAUZE/BANDAGES/DRESSINGS) ×1
ATTRACTOMAT 16X20 MAGNETIC DRP (DRAPES) IMPLANT
BRR ADH 6X5 SEPRAFILM 1 SHT (MISCELLANEOUS) ×1
CELLS DAT CNTRL 66122 CELL SVR (MISCELLANEOUS) ×1 IMPLANT
CHLORAPREP W/TINT 26ML (MISCELLANEOUS) ×2 IMPLANT
CLIP TI MEDIUM 6 (CLIP) ×1 IMPLANT
CLIP TI MEDIUM LARGE 6 (CLIP) IMPLANT
CONT SPEC 4OZ CLIKSEAL STRL BL (MISCELLANEOUS) IMPLANT
COVER SURGICAL LIGHT HANDLE (MISCELLANEOUS) ×2 IMPLANT
DERMABOND ADVANCED (GAUZE/BANDAGES/DRESSINGS) ×1
DERMABOND ADVANCED .7 DNX12 (GAUZE/BANDAGES/DRESSINGS) IMPLANT
DRAPE UTILITY 15X26 (DRAPE) ×2 IMPLANT
DRAPE WARM FLUID 44X44 (DRAPE) ×2 IMPLANT
DRESSING TELFA ISLAND 4X8 (GAUZE/BANDAGES/DRESSINGS) ×2 IMPLANT
DRSG OPSITE POSTOP 4X8 (GAUZE/BANDAGES/DRESSINGS) ×1 IMPLANT
ELECT LIGASURE SHORT 9 REUSE (ELECTRODE) IMPLANT
ELECT REM PT RETURN 9FT ADLT (ELECTROSURGICAL) ×2
ELECTRODE REM PT RTRN 9FT ADLT (ELECTROSURGICAL) ×1 IMPLANT
FLOSEAL 10ML (HEMOSTASIS) ×1 IMPLANT
GAUZE SPONGE 4X4 12PLY STRL (GAUZE/BANDAGES/DRESSINGS) IMPLANT
GAUZE SPONGE 4X4 16PLY XRAY LF (GAUZE/BANDAGES/DRESSINGS) ×1 IMPLANT
GLOVE BIO SURGEON STRL SZ 6 (GLOVE) ×4 IMPLANT
GLOVE BIO SURGEON STRL SZ 6.5 (GLOVE) ×4 IMPLANT
GOWN STRL REUS W/ TWL LRG LVL3 (GOWN DISPOSABLE) ×2 IMPLANT
GOWN STRL REUS W/TWL LRG LVL3 (GOWN DISPOSABLE) ×4
KIT BASIN OR (CUSTOM PROCEDURE TRAY) ×2 IMPLANT
LIQUID BAND (GAUZE/BANDAGES/DRESSINGS) IMPLANT
LOOP VESSEL MAXI BLUE (MISCELLANEOUS) IMPLANT
MARKER SKIN DUAL TIP RULER LAB (MISCELLANEOUS) ×1 IMPLANT
NDL SPNL 22GX2.5 QUINCKE BK (NEEDLE) IMPLANT
NDL SPNL 22GX3.5 QUINCKE BK (NEEDLE) IMPLANT
NEEDLE HYPO 22GX1.5 SAFETY (NEEDLE) ×4 IMPLANT
NEEDLE SPNL 22GX2.5 QUINCKE BK (NEEDLE) ×2 IMPLANT
NEEDLE SPNL 22GX3.5 QUINCKE BK (NEEDLE) ×2 IMPLANT
NS IRRIG 1000ML POUR BTL (IV SOLUTION) ×8 IMPLANT
PACK GENERAL/GYN (CUSTOM PROCEDURE TRAY) ×2 IMPLANT
RETRACTOR WND ALEXIS 18 MED (MISCELLANEOUS) IMPLANT
RTRCTR WOUND ALEXIS 18CM MED (MISCELLANEOUS) ×2
SEPRAFILM MEMBRANE 5X6 (MISCELLANEOUS) ×1 IMPLANT
SHEET LAVH (DRAPES) ×2 IMPLANT
SPONGE LAP 18X18 X RAY DECT (DISPOSABLE) IMPLANT
STAPLER VISISTAT 35W (STAPLE) IMPLANT
SUT MNCRL AB 4-0 PS2 18 (SUTURE) IMPLANT
SUT MON AB 2-0 CT1 36 (SUTURE) ×2 IMPLANT
SUT PDS AB 1 TP1 96 (SUTURE) ×4 IMPLANT
SUT VIC AB 0 CT1 27 (SUTURE) ×2
SUT VIC AB 0 CT1 27XBRD ANTBC (SUTURE) IMPLANT
SUT VIC AB 0 CT1 36 (SUTURE) ×6 IMPLANT
SUT VIC AB 2-0 CT1 36 (SUTURE) ×4 IMPLANT
SUT VIC AB 2-0 CT2 27 (SUTURE) ×13 IMPLANT
SUT VIC AB 3-0 CTX 36 (SUTURE) IMPLANT
SUT VIC AB 3-0 SH 27 (SUTURE) ×2
SUT VIC AB 3-0 SH 27XBRD (SUTURE) IMPLANT
SUT VICRYL 2 0 18  UND BR (SUTURE) ×1
SUT VICRYL 2 0 18 UND BR (SUTURE) IMPLANT
SYR 20CC LL (SYRINGE) ×4 IMPLANT
SYR CONTROL 10ML LL (SYRINGE) ×1 IMPLANT
TOWEL OR 17X26 10 PK STRL BLUE (TOWEL DISPOSABLE) ×2 IMPLANT
TOWEL OR NON WOVEN STRL DISP B (DISPOSABLE) ×2 IMPLANT
TRAY FOLEY CATH 14FRSI W/METER (CATHETERS) ×2 IMPLANT
WATER STERILE IRR 1500ML POUR (IV SOLUTION) ×2 IMPLANT

## 2014-06-26 NOTE — Interval H&P Note (Signed)
History and Physical Interval Note:  06/26/2014 1:48 PM  Claudia Berger  has presented today for surgery, with the diagnosis of UTERINE MASS   The various methods of treatment have been discussed with the patient and family. After consideration of risks, benefits and other options for treatment, the patient has consented to  Procedure(s): EXPLORATORY LAPAROTOMY MYOMECTOMY POSSIBLE HYSTERECTOMY  (N/A) as a surgical intervention .  The patient's history has been reviewed, patient examined, no change in status, stable for surgery.  I have reviewed the patient's chart and labs.  Questions were answered to the patient's satisfaction.     Donaciano Eva

## 2014-06-26 NOTE — Op Note (Signed)
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    Anesthesia: General   Estimated blood loss: 540 ml  Complications: None   Pathology: uterine mass, right round ligament mass  Operative findings: 15cm cystic mass erupting from the right uterine cornua via a pedunculated 2cm stalk. The mass was ruptured spontaneously preoperatively and a small amount of cystic fluid/ascites was in the pelvis. The mass contained multiple cystic necrotic components. It lacked a clear capsule. There was a small segment of mass on the residual right round ligament. No residual tumor was present at the completion of the surgery. Frozen section revealed spindle cell neoplasm (no malignancy identified). The fallopian tubes and ovaries were normal bilaterally.  Procedure: The patient was identified in the preoperative holding area. Informed consent was signed on the chart. Patient was seen history was reviewed and exam was performed.   The patient was then taken to the operating room and placed in the supine position with SCD hose on. General anesthesia was then induced without difficulty. She was then placed in the dorsolithotomy position. The abdomen was prepped with chlor prep sponges per protocol. Perineum was prepped with Betadine. The vagina was prepped with Betadine a Foley catheter was inserted into the bladder under sterile conditions.  The patient was then draped after the prep was dried. Timeout was performed the patient, procedure, antibiotic, allergy, and length of procedure. A vertical midline infraumbilical incision  was and carried down to the underlying fascia using Bovie cautery. The fascia was scored in the fascial incision was extended superiorly and inferiorly using Bovie cautery. The rectus bellies were dissected off the overlying fascia. The peritoneum was tented and entered. The peritoneal incision was extended superiorly and inferiorly with visualization of the underlying peritoneal cavity. The alexis self-retaining retractor was then placed. At the initial placement as well as at several points during the case the lateral blades were checked to ensure no significant pressure on the psoas bellies.  The small and large bowel were packed out of the way of the surgical field with moist laparotomy sponges. The cystic mass was delivered into the abdomen. Vasopressin (20 units in 60cc saline) was infiltrated into the stalk of the mass where it attached to the uterine cornua. A total of 20cc was infiltrated. The bovie was used to score the serosa overlying the attachment. Blunt dissection and monopolar was used to dissect the mass from the uterine myometrium. A clear capsule was not identified. The mass was resected in entirity and sent for frozen section (see above). The uterine cavity was not breached. The myometrial defect on the right cornua was closed with 2-0 monocryl in a running locked suture in a 3 layer closure. Hemostasis was achieved at the level of the uterine vessels where they assended along the lateral border of the uterus using hemaclips. The incision in the broad ligament which approximated the ovary and tube was made hemostatic with monopolar energy. The uterine incision was covered with peritoneum from the residual round ligament. Additional tumor tissue was identified on the right round ligament which was completely resected. No macroscopic tumor was visible at the completion of the surgery. Floseal was applied  to the surgical bed and seprafilm was placed over the tube and ovary and uterine incision to  prevent adhesions.  The abdomen pelvis had been copiously irrigated.   The retractor and laparotomy sponges were removed. The fascia was closed using running mass closure of #1 PDS. The subcutaneous tissues were irrigated and made hemostatic. 20 mL of Exparel within 20 mL of normal saline was injected for postoperative pain control. The skin was closed using 4-0 monocryl.  All instrument, suture, laparotomy, Ray-Tec, and needle counts were correct x2. The patient tolerated the procedure well and was taken recovery room in stable condition. This is Everitt Amber dictating an operative note on Claudia Berger.   Donaciano Eva, MD

## 2014-06-26 NOTE — H&P (View-Only) (Signed)
Consult Note: Gyn-Onc  Consult was requested by Dr. Jobe Igo for the evaluation of Claudia Berger 39 y.o. female with a uterine mass  CC:  Chief Complaint  Patient presents with  . uterine mass    Assessment/Plan:  Claudia Berger  is a 39 y.o.  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 The diagnosis of mitotically active myoma versus sarcoma can only be made with definitive pathology, and not on imaging alone and the patient understands this potential diagnosis. It is for this reason that I am recommending surgical excision with an exploratory laparotomy myomectomy and possible hysterectomy. The ovaries appear normal and uninvolved on imaging. I discussed that even if sarcoma is identified ovarian preservation is reasonable. I discussed that frozen section we performed of the mass is sarcoma is identified at the time of surgery I will perform a definitive total hysterectomy. However, because the patient desires the potential for future childbearing, if frozen section reveals a more benign process, we will limit the surgery to myomectomy. I recommended that she undergo completion hysterectomy when she has determined that her childbearing is complete. I discussed that she would require cesarean section for the delivery of any children she should conceive. I discussed that frozen section carries with it diagnostic area, particularly in cases such as this where a spectrum diagnosis between benign and malignant is challenging. In such a case it may be necessary to perform a second surgery (completion hysterectomy) if there is a discrepancy between intraoperative frozen section revealing benign pathology and definitive pathology confirming malignancy.  I discussed operative risks including  bleeding, infection, damage to internal organs (such as bladder,ureters, bowels), blood clot, reoperation and rehospitalization. I discussed that these  are higher for patients recorded who have undergone a prior major laparotomy in the past. I discussed anticipated postoperative recovery and hospital stay.  We will assess LDH preoperatively as a potential tumor marker for leiomyosarcoma but I discussed the nonspecific nature of this test to the patient.   HPI: Claudia Berger is a 39 year old gravida 0 who is seen in consultation at the request of Dr. Jobe Igo for a large right uterine fundal mass. 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 Nancy Marus on 11/12/2005. At that time the mass was erupting from the broad ligament and she received a myomectomy to excise it. The tumor was ER/PR positive endometrial curettings were negative. It was felt that it represented a benign process and, while recurrence was certainly possible, she elected for close follow-up with no completion hysterectomy as she desired the potential for future childbearing.  She been doing well and was asymptomatic until she began experiencing low back pain approximately one month ago in November 2011 she had an annual gynecologic exam which included a normal Pap smear (with negative HPV) and a transvaginal ultrasound to monitor her known uterine fibroids. The transvaginal ultrasound revealed a 13 x 12 x 8 cm pliable mass arising from the uterine fundus with cystic and solid components and positive Doppler flow within. Both ovaries were identified separate from the mass and appeared normal. Because of the concerning features on ultrasound she underwent an MRI of the pelvis with and without contrast on 05/15/2014 at [facility]. This confirmed 14.6 x 9.1 x 11.9 cm large complex solid and  cystic mass immediately subadjacent and contiguous with the right superior uterine fundus anteriorly, extending superiorly to the level of the umbilicus, with cystic components tracking  posterior to the uterus in the cul-de-sac. The uterus itself appeared normal at 5.7 x 4.1 x 3.2 cm there was no evidence of metastatic lesions with no enlarged retroperitoneal lymph nodes, and no peritoneal disease.  Interval History: persistent mild back pain  Current Meds:  Outpatient Encounter Prescriptions as of 06/04/2014  Medication Sig  . ARIPiprazole (ABILIFY) 15 MG tablet Take 15 mg by mouth daily.  Marland Kitchen levocetirizine (XYZAL) 5 MG tablet Take 5 mg by mouth.  . Norethin Ace-Eth Estrad-FE (MINASTRIN 24 FE PO) Take 1 tablet by mouth daily.  . valACYclovir (VALTREX) 500 MG tablet Take 500 mg by mouth daily.  . [DISCONTINUED] nystatin-triamcinolone (MYCOLOG II) cream Apply 1 application topically 2 (two) times daily.    Allergy:  Allergies  Allergen Reactions  . Penicillins Rash  . Sulfa Antibiotics Rash    Social Hx:   History   Social History  . Marital Status: Married    Spouse Name: N/A    Number of Children: N/A  . Years of Education: N/A   Occupational History  . Not on file.   Social History Main Topics  . Smoking status: Former Research scientist (life sciences)  . Smokeless tobacco: Not on file  . Alcohol Use: Yes     Comment: socially  . Drug Use: No  . Sexual Activity: Not on file   Other Topics Concern  . Not on file   Social History Narrative    Past Surgical Hx:  Past Surgical History  Procedure Laterality Date  . Leep    . Myomectomy      Past Medical Hx:  Past Medical History  Diagnosis Date  . Cervical dysplasia   . Herpes simplex   . Leiomyoma of uterus     Past Gynecological History:  Cervical dysplasia, LEEP in2010. Normal pap and neg hrHPV 11/15. G0.  No LMP recorded.  Family Hx:  Family History  Problem Relation Age of Onset  . Colon cancer Father 24  . Alzheimer's disease Maternal Grandfather   . Alzheimer's disease Maternal Grandmother   . Diabetes Father     Review of Systems:  Constitutional  Feels well,    ENT Normal appearing ears and  nares bilaterally Skin/Breast  No rash, sores, jaundice, itching, dryness Cardiovascular  No chest pain, shortness of breath, or edema  Pulmonary  No cough or wheeze.  Gastro Intestinal  No nausea, vomitting, or diarrhoea. No bright red blood per rectum, no abdominal pain, change in bowel movement, or constipation.  Genito Urinary  No frequency, urgency, dysuria, see HPI Musculo Skeletal  No myalgia, arthralgia, joint swelling, + back pain  Neurologic  No weakness, numbness, change in gait,  Psychology  No depression, anxiety, insomnia.   Vitals:  Blood pressure 138/87, pulse 86, temperature 98.9 F (37.2 C), temperature source Oral, resp. rate 20, height 5\' 3"  (1.6 m), weight 141 lb 12.8 oz (64.32 kg).  Physical Exam: WD in NAD Neck  Supple NROM, without any enlargements.  Lymph Node Survey No cervical supraclavicular or inguinal adenopathy Cardiovascular  Pulse normal rate, regularity and rhythm. S1 and S2 normal.  Lungs  Clear to auscultation bilateraly, without wheezes/crackles/rhonchi. Good air movement.  Skin  No rash/lesions/breakdown  Psychiatry  Alert and oriented to person, place, and time  Abdomen  Normoactive bowel sounds, abdomen soft, non-tender and thin without evidence of hernia.  Back No CVA tenderness Genito Urinary  Vulva/vagina: Normal external female genitalia.  No lesions. No discharge or bleeding.  Bladder/urethra:  No lesions or masses, well supported bladder  Vagina: normal  Cervix: Normal appearing, no lesions.  Uterus: mass extending to umbilicus, soft, mobile, contiguous with small, mobile uterus. No parametrial involvement or nodularity.  Adnexa: central pelvic mass. Rectal  Good tone, no masses no cul de sac nodularity.  Extremities  No bilateral cyanosis, clubbing or edema.   Donaciano Eva, MD   06/04/2014, 2:53 PM

## 2014-06-26 NOTE — Transfer of Care (Signed)
Immediate Anesthesia Transfer of Care Note  Patient: Claudia Berger  Procedure(s) Performed: Procedure(s) (LRB): EXPLORATORY LAPAROTOMY MYOMECTOMY  (N/A)  Patient Location: PACU  Anesthesia Type: General  Level of Consciousness: sedated, patient cooperative and responds to stimulation  Airway & Oxygen Therapy: Patient Spontanous Breathing and Patient connected to face mask oxgen  Post-op Assessment: Report given to PACU RN and Post -op Vital signs reviewed and stable  Post vital signs: Reviewed and stable  Complications: No apparent anesthesia complications

## 2014-06-26 NOTE — Anesthesia Procedure Notes (Signed)
Procedure Name: Intubation Date/Time: 06/26/2014 2:32 PM Performed by: Ofilia Neas Pre-anesthesia Checklist: Patient identified, Emergency Drugs available, Patient being monitored, Suction available and Timeout performed Patient Re-evaluated:Patient Re-evaluated prior to inductionOxygen Delivery Method: Circle system utilized Preoxygenation: Pre-oxygenation with 100% oxygen Intubation Type: IV induction and Cricoid Pressure applied Ventilation: Mask ventilation without difficulty Laryngoscope Size: Mac and 4 Grade View: Grade II Tube type: Glide rite Tube size: 7.5 mm Number of attempts: 2 Airway Equipment and Method: Video-laryngoscopy (elective glidescope due to airway appearance, no chin, no intubation  hx) Placement Confirmation: ETT inserted through vocal cords under direct vision,  positive ETCO2 and breath sounds checked- equal and bilateral Secured at: 21 cm Tube secured with: Tape Dental Injury: Teeth and Oropharynx as per pre-operative assessment  Difficulty Due To: Difficulty was anticipated, Difficult Airway- due to limited oral opening and Difficult Airway- due to anterior larynx Future Recommendations: Recommend- induction with short-acting agent, and alternative techniques readily available

## 2014-06-26 NOTE — Anesthesia Postprocedure Evaluation (Signed)
  Anesthesia Post-op Note  Patient: Claudia Berger  Procedure(s) Performed: Procedure(s) (LRB): EXPLORATORY LAPAROTOMY MYOMECTOMY  (N/A)  Patient Location: PACU  Anesthesia Type: General  Level of Consciousness: awake and alert   Airway and Oxygen Therapy: Patient Spontanous Breathing  Post-op Pain: mild  Post-op Assessment: Post-op Vital signs reviewed, Patient's Cardiovascular Status Stable, Respiratory Function Stable, Patent Airway and No signs of Nausea or vomiting  Last Vitals:  Filed Vitals:   06/26/14 1900  BP: 123/80  Pulse: 71  Temp: 37 C  Resp: 18    Post-op Vital Signs: stable   Complications: No apparent anesthesia complications

## 2014-06-26 NOTE — Anesthesia Preprocedure Evaluation (Signed)
Anesthesia Evaluation  Patient identified by MRN, date of birth, ID band Patient awake    Reviewed: Allergy & Precautions, NPO status , Patient's Chart, lab work & pertinent test results  Airway Mallampati: II  TM Distance: >3 FB Neck ROM: Full    Dental no notable dental hx.    Pulmonary neg pulmonary ROS, former smoker,  breath sounds clear to auscultation  Pulmonary exam normal       Cardiovascular negative cardio ROS  Rhythm:Regular Rate:Normal     Neuro/Psych negative neurological ROS  negative psych ROS   GI/Hepatic negative GI ROS, Neg liver ROS,   Endo/Other  negative endocrine ROS  Renal/GU negative Renal ROS  negative genitourinary   Musculoskeletal negative musculoskeletal ROS (+)   Abdominal   Peds negative pediatric ROS (+)  Hematology negative hematology ROS (+)   Anesthesia Other Findings   Reproductive/Obstetrics negative OB ROS                             Anesthesia Physical Anesthesia Plan  ASA: II  Anesthesia Plan: General   Post-op Pain Management:    Induction: Intravenous  Airway Management Planned: Oral ETT  Additional Equipment:   Intra-op Plan:   Post-operative Plan: Extubation in OR  Informed Consent: I have reviewed the patients History and Physical, chart, labs and discussed the procedure including the risks, benefits and alternatives for the proposed anesthesia with the patient or authorized representative who has indicated his/her understanding and acceptance.   Dental advisory given  Plan Discussed with: CRNA  Anesthesia Plan Comments:         Anesthesia Quick Evaluation

## 2014-06-27 ENCOUNTER — Encounter (HOSPITAL_COMMUNITY): Payer: Self-pay | Admitting: Gynecologic Oncology

## 2014-06-27 LAB — BASIC METABOLIC PANEL
Anion gap: 5 (ref 5–15)
BUN: 6 mg/dL (ref 6–23)
CO2: 23 mmol/L (ref 19–32)
Calcium: 8.4 mg/dL (ref 8.4–10.5)
Chloride: 110 mmol/L (ref 96–112)
Creatinine, Ser: 0.64 mg/dL (ref 0.50–1.10)
GFR calc non Af Amer: >90 mL/min (ref >90)
GLUCOSE: 178 mg/dL — AB (ref 70–99)
Potassium: 4.3 mmol/L (ref 3.5–5.1)
SODIUM: 138 mmol/L (ref 135–145)

## 2014-06-27 LAB — CBC
HCT: 34.8 % — ABNORMAL LOW (ref 36.0–46.0)
HEMOGLOBIN: 11.7 g/dL — AB (ref 12.0–15.0)
MCH: 31.3 pg (ref 26.0–34.0)
MCHC: 33.6 g/dL (ref 30.0–36.0)
MCV: 93 fL (ref 78.0–100.0)
PLATELETS: 245 10*3/uL (ref 150–400)
RBC: 3.74 MIL/uL — ABNORMAL LOW (ref 3.87–5.11)
RDW: 12.7 % (ref 11.5–15.5)
WBC: 9.1 10*3/uL (ref 4.0–10.5)

## 2014-06-27 MED ORDER — OXYCODONE HCL 5 MG PO TABS: 5.0000 mg | ORAL_TABLET | ORAL | Status: DC | PRN

## 2014-06-27 NOTE — Progress Notes (Signed)
1 Day Post-Op Procedure(s) (LRB): EXPLORATORY LAPAROTOMY MYOMECTOMY  (N/A)  Subjective: Patient reports feeling well this am.  Tolerated solid food for breakfast with no nausea or emesis.  Passing flatus with no bowel movement post-op.  Ambulating without difficulty.  Minimal pain reported.  Denies chest pain, dyspnea.  No concerns voiced.  Objective: Vital signs in last 24 hours: Temp:  [97.4 F (36.3 C)-98.9 F (37.2 C)] 98.8 F (37.1 C) (02/10 0620) Pulse Rate:  [53-77] 77 (02/10 0620) Resp:  [10-18] 16 (02/10 0620) BP: (100-126)/(52-81) 101/67 mmHg (02/10 0620) SpO2:  [100 %] 100 % (02/10 0620) Weight:  [140 lb (63.504 kg)] 140 lb (63.504 kg) (02/09 1100) Last BM Date: 06/26/14  Intake/Output from previous day: 02/09 0701 - 02/10 0700 In: 5055.8 [P.O.:1200; I.V.:3855.8] Out: 3400 [Urine:3350; Blood:50]  Physical Examination: General: alert, cooperative and no distress Resp: clear to auscultation bilaterally Cardio: regular rate and rhythm, S1, S2 normal, no murmur, click, rub or gallop GI: soft, non-tender; bowel sounds normal; no masses,  no organomegaly and incision: midline incision with dermabond without erythema or drainage Extremities: extremities normal, atraumatic, no cyanosis or edema  Labs: WBC/Hgb/Hct/Plts:  9.1/11.7/34.8/245 (02/10 0439) BUN/Cr/glu/ALT/AST/amyl/lip:  6/0.64/--/--/--/--/-- (02/10 3559)  Assessment: 39 y.o. s/p Procedure(s): EXPLORATORY LAPAROTOMY MYOMECTOMY : stable Pain:  Pain is well-controlled on PRN medications.  Heme: Hgb 11.7 and Hct 34.8 this am.  Stable post-operatively.  CV: BP and HR stable post-op.  Continue to monitor with routine vital signs.  GI:  Tolerating po: Yes    GU: Adequate output reported.    FEN: Stable post-operatively.  Prophylaxis: intermittent pneumatic compression boots.  Plan: Saline lock IV Diet as tolerated PO pain medication PRN Encourage ambulation, IS use, deep breathing, and coughing Continue  post-operative plan per Dr. Delsa Sale Plan for possible discharge later today   LOS: 1 day    Sachiko Methot DEAL 06/27/2014, 9:57 AM

## 2014-06-27 NOTE — Discharge Summary (Signed)
Physician Discharge Summary  Patient ID: Claudia Berger MRN: [id number] DOB/AGE: 01/05/1976 39 y.o.  Admit date: 06/26/2014 Discharge date: 06/27/2014  Admission Diagnoses: Uterine mass  Discharge Diagnoses:  Principal Problem:   Uterine mass Active Problems:   Pelvic mass in female   Discharged Condition:  The patient is in good condition and stable for discharge.    Hospital Course: On 06/26/2014, the patient underwent the following: Procedure(s): EXPLORATORY LAPAROTOMY MYOMECTOMY . The postoperative course was uneventful.  She was discharged to home on postoperative day 1 tolerating a regular diet, passing flatus, with minimal pain.  Consults: None  Significant Diagnostic Studies: None  Treatments: surgery: see above  Discharge Exam: Blood pressure 120/81, pulse 73, temperature 97.7 F (36.5 C), temperature source Oral, resp. rate 14, height 5\' 3"  (1.6 m), weight 140 lb (63.504 kg), last menstrual period 06/15/2014, SpO2 100 %. General appearance: alert, cooperative and no distress Resp: clear to auscultation bilaterally Cardio: regular rate and rhythm, S1, S2 normal, no murmur, click, rub or gallop GI: soft, non-tender; bowel sounds normal; no masses,  no organomegaly Extremities: extremities normal, atraumatic, no cyanosis or edema Incision/Wound: midline incision with dermabond without erythema or drainage  Disposition: Home      Discharge Instructions    Call MD for:  difficulty breathing, headache or visual disturbances    Complete by:  As directed      Call MD for:  extreme fatigue    Complete by:  As directed      Call MD for:  hives    Complete by:  As directed      Call MD for:  persistant dizziness or light-headedness    Complete by:  As directed      Call MD for:  persistant nausea and vomiting    Complete by:  As directed      Call MD for:  redness, tenderness, or signs of infection (pain, swelling, redness, odor or green/yellow discharge around  incision site)    Complete by:  As directed      Call MD for:  severe uncontrolled pain    Complete by:  As directed      Call MD for:  temperature >100.4    Complete by:  As directed      Diet - low sodium heart healthy    Complete by:  As directed      Driving Restrictions    Complete by:  As directed   No driving for 2 weeks.  Do not take narcotics and drive.     Increase activity slowly    Complete by:  As directed      Lifting restrictions    Complete by:  As directed   No lifting greater than 10 lbs.     Sexual Activity Restrictions    Complete by:  As directed   No sexual activity, nothing in the vagina, for 6 weeks.            Medication List    TAKE these medications        ARIPiprazole 2 MG tablet  Commonly known as:  ABILIFY  Take 2 mg by mouth every morning.     ibuprofen 200 MG tablet  Commonly known as:  ADVIL,MOTRIN  Take 400 mg by mouth every 6 (six) hours as needed for moderate pain.     levocetirizine 5 MG tablet  Commonly known as:  XYZAL  Take 5 mg by mouth every morning.     MINASTRIN  24 FE PO  Take 1 tablet by mouth every morning.     oxyCODONE 5 MG immediate release tablet  Commonly known as:  Oxy IR/ROXICODONE  Take 1 tablet (5 mg total) by mouth every 4 (four) hours as needed for severe pain or breakthrough pain.     valACYclovir 500 MG tablet  Commonly known as:  VALTREX  Take 500 mg by mouth every morning.       Follow-up Information    Follow up with Donaciano Eva, MD On 07/16/2014.   Specialty:  Obstetrics and Gynecology   Why:  at 11:45am at the [facility] information:   [place] [address] [phone]       Greater than thirty minutes were spend for face to face discharge instructions and discharge orders/summary in EPIC.   Signed: CROSS, MELISSA DEAL 06/27/2014, 2:06 PM

## 2014-06-27 NOTE — Discharge Instructions (Signed)
06/27/2014  Return to work: 4-6 weeks if applicable  Activity: 1. Be up and out of the bed during the day.  Take a nap if needed.  You may walk up steps but be careful and use the hand rail.  Stair climbing will tire you more than you think, you may need to stop part way and rest.   2. No lifting or straining for 6 weeks.  3. No driving for 2 week(s).  Do not drive if you are taking narcotic pain medicine.  4. Shower daily.  Use soap and water on your incision and pat dry; don't rub.  No tub baths until cleared by your surgeon.   5. No sexual activity and nothing in the vagina for 6 weeks.  Diet: 1. Low sodium Heart Healthy Diet is recommended.  2. It is safe to use a laxative, such as Miralax or Colace, if you have difficulty moving your bowels.   Wound Care: 1. Keep clean and dry.  Shower daily.  Reasons to call the Doctor:  Fever - Oral temperature greater than 100.4 degrees Fahrenheit  Foul-smelling vaginal discharge  Difficulty urinating  Nausea and vomiting  Increased pain at the site of the incision that is unrelieved with pain medicine.  Difficulty breathing with or without chest pain  New calf pain especially if only on one side  Sudden, continuing increased vaginal bleeding with or without clots.   Contacts: For questions or concerns you should contact:  Dr. Everitt Amber at [phone]  Joylene John, NP at [phone]  After Hours: call [phone] and ask for the GYN Oncologist on call  Myomectomy, Care After Refer to this sheet in the next few weeks. These instructions provide you with information on caring for yourself after your procedure. Your health care provider may also give you more specific instructions. Your treatment has been planned according to current medical practices, but problems sometimes occur. Call your health care provider if you have any problems or questions after your procedure. WHAT TO EXPECT AFTER THE PROCEDURE After your  procedure, it is typical to have the following:  Pain in your abdomen, especially at any incision sites. You will be given pain medicine to control the pain.  Tiredness. This is a normal part of the recovery process. Your energy level will return to normal over the next several weeks.  Constipation.  Vaginal bleeding. This is normal and should stop after 1-2 weeks. HOME CARE INSTRUCTIONS   Only take over-the-counter or prescription medicines as directed by your health care provider. Avoid aspirin because it can cause bleeding.  Do not douche, use tampons, or have sexual intercourse until given permission by your health care provider.  Remove or change any bandages (dressings) as directed by your health care provider.  Take showers instead of baths as directed by your health care provider.  You will probably be able to go back to your normal routine after a few days. Do not do anything that requires extra effort until your health care provider says it is okay. Do not lift anything heavier than 15 pounds (6.8 kg) until your health care provider approves.  Walk daily but take frequent rest breaks if you tire easily.  Continue to practice deep breathing and coughing. If it hurts to cough, try holding a pillow against your belly as you cough.  If you become constipated, you may:  Use a mild laxative if your health care provider approves.  Add more fruit and bran to your diet.  Drink enough fluids to keep your urine clear or pale yellow.  Take your temperature twice a day and write it down.  Do not drink alcohol.  Do not drive until your health care provider approves.  Have someone help you at home for 1 week or until you can do your own household activities.  Follow up with your health care provider as directed. SEEK MEDICAL CARE IF:  You have a fever.  You have increasing abdominal pain that is not relieved with medicine.  You have nausea, vomiting, or diarrhea.  You  have pain when you urinate, or you have blood in your urine.  You have a rash on your body.  You have pain or redness where your IV access tube was inserted.  You have redness, swelling, or any kind of drainage from an incision. SEEK IMMEDIATE MEDICAL CARE IF:   You have weakness or lightheadedness.  You have pain, swelling, or redness in your legs.  You have chest pain.  You faint.  You have shortness of breath.  You have heavy vaginal bleeding.  Your incision is opening up. Document Released: 09/24/2010 Document Revised: 02/22/2013 Document Reviewed: 12/14/2012 [facility] Patient Information 2015 [place], [place]. This information is not intended to replace advice given to you by your health care provider. Make sure you discuss any questions you have with your health care provider.  Oxycodone tablets or capsules What is this medicine? OXYCODONE (ox i KOE done) is a pain reliever. It is used to treat moderate to severe pain. This medicine may be used for other purposes; ask your health care provider or pharmacist if you have questions. COMMON BRAND NAME(S): Dazidox, Endocodone, OXECTA, OxyIR, Percolone, Roxicodone What should I tell my health care provider before I take this medicine? They need to know if you have any of these conditions: -Addison's disease -brain tumor -drug abuse or addiction -head injury -heart disease -if you frequently drink alcohol containing drinks -kidney disease or problems going to the bathroom -liver disease -lung disease, asthma, or breathing problems -mental problems -an unusual or allergic reaction to oxycodone, codeine, hydrocodone, morphine, other medicines, foods, dyes, or preservatives -pregnant or trying to get pregnant -breast-feeding How should I use this medicine? Take this medicine by mouth with a glass of water. Follow the directions on the prescription label. You can take it with or without food. If it upsets your stomach, take it  with food. Take your medicine at regular intervals. Do not take it more often than directed. Do not stop taking except on your doctor's advice. Some brands of this medicine, like Oxecta, have special instructions. Ask your doctor or pharmacist if these directions are for you: Do not cut, crush or chew this medicine. Swallow only one tablet at a time. Do not wet, soak, or lick the tablet before you take it. Talk to your pediatrician regarding the use of this medicine in children. Special care may be needed. Overdosage: If you think you have taken too much of this medicine contact a poison control center or emergency room at once. NOTE: This medicine is only for you. Do not share this medicine with others. What if I miss a dose? If you miss a dose, take it as soon as you can. If it is almost time for your next dose, take only that dose. Do not take double or extra doses. What may interact with this medicine? -alcohol -antihistamines -certain medicines used for nausea like chlorpromazine, droperidol -erythromycin -ketoconazole -medicines for depression, anxiety, or  psychotic disturbances -medicines for sleep -muscle relaxants -naloxone -naltrexone -narcotic medicines (opiates) for pain -nilotinib -phenobarbital -phenytoin -rifampin -ritonavir -voriconazole This list may not describe all possible interactions. Give your health care provider a list of all the medicines, herbs, non-prescription drugs, or dietary supplements you use. Also tell them if you smoke, drink alcohol, or use illegal drugs. Some items may interact with your medicine. What should I watch for while using this medicine? Tell your doctor or health care professional if your pain does not go away, if it gets worse, or if you have new or a different type of pain. You may develop tolerance to the medicine. Tolerance means that you will need a higher dose of the medicine for pain relief. Tolerance is normal and is expected if you  take this medicine for a long time. Do not suddenly stop taking your medicine because you may develop a severe reaction. Your body becomes used to the medicine. This does NOT mean you are addicted. Addiction is a behavior related to getting and using a drug for a non-medical reason. If you have pain, you have a medical reason to take pain medicine. Your doctor will tell you how much medicine to take. If your doctor wants you to stop the medicine, the dose will be slowly lowered over time to avoid any side effects. You may get drowsy or dizzy when you first start taking this medicine or change doses. Do not drive, use machinery, or do anything that may be dangerous until you know how the medicine affects you. Stand or sit up slowly. There are different types of narcotic medicines (opiates) for pain. If you take more than one type at the same time, you may have more side effects. Give your health care provider a list of all medicines you use. Your doctor will tell you how much medicine to take. Do not take more medicine than directed. Call emergency for help if you have problems breathing. This medicine will cause constipation. Try to have a bowel movement at least every 2 to 3 days. If you do not have a bowel movement for 3 days, call your doctor or health care professional. Your mouth may get dry. Drinking water, chewing sugarless gum, or sucking on hard candy may help. See your dentist every 6 months. What side effects may I notice from receiving this medicine? Side effects that you should report to your doctor or health care professional as soon as possible: -allergic reactions like skin rash, itching or hives, swelling of the face, lips, or tongue -breathing problems -confusion -feeling faint or lightheaded, falls -trouble passing urine or change in the amount of urine -unusually weak or tired Side effects that usually do not require medical attention (report to your doctor or health care  professional if they continue or are bothersome): -constipation -dry mouth -itching -nausea, vomiting -upset stomach This list may not describe all possible side effects. Call your doctor for medical advice about side effects. You may report side effects to FDA at 1-800-FDA-1088. Where should I keep my medicine? Keep out of the reach of children. This medicine can be abused. Keep your medicine in a safe place to protect it from theft. Do not share this medicine with anyone. Selling or giving away this medicine is dangerous and against the law. Store at room temperature between 15 and 30 degrees C (59 and 86 degrees F). Protect from light. Keep container tightly closed. This medicine may cause accidental overdose and death if it is  taken by other adults, children, or pets. Flush any unused medicine down the toilet to reduce the chance of harm. Do not use the medicine after the expiration date. NOTE: This sheet is a summary. It may not cover all possible information. If you have questions about this medicine, talk to your doctor, pharmacist, or health care provider.  2015, [employer]vier/[employer] Standard. (2013-01-12 13:43:33)

## 2014-06-27 NOTE — Progress Notes (Signed)
Discharge instructions given to patient along with prescriptions.  Questions answered 

## 2014-06-29 LAB — TYPE AND SCREEN
ABO/RH(D): A POS
Antibody Screen: NEGATIVE
UNIT DIVISION: 0
Unit division: 0

## 2014-07-16 ENCOUNTER — Ambulatory Visit: Payer: BLUE CROSS/BLUE SHIELD | Attending: Gynecologic Oncology | Admitting: Gynecologic Oncology

## 2014-07-16 ENCOUNTER — Encounter: Payer: Self-pay | Admitting: Gynecologic Oncology

## 2014-07-16 VITALS — BP 117/48 | HR 83 | Temp 98.3°F | Resp 16 | Ht 63.0 in | Wt 139.2 lb

## 2014-07-16 DIAGNOSIS — N858 Other specified noninflammatory disorders of uterus: Secondary | ICD-10-CM

## 2014-07-16 DIAGNOSIS — Z48816 Encounter for surgical aftercare following surgery on the genitourinary system: Secondary | ICD-10-CM

## 2014-07-16 DIAGNOSIS — D259 Leiomyoma of uterus, unspecified: Secondary | ICD-10-CM

## 2014-07-16 DIAGNOSIS — Z8742 Personal history of other diseases of the female genital tract: Secondary | ICD-10-CM | POA: Diagnosis not present

## 2014-07-16 DIAGNOSIS — D481 Neoplasm of uncertain behavior of connective and other soft tissue: Secondary | ICD-10-CM

## 2014-07-16 DIAGNOSIS — D4819 Other specified neoplasm of uncertain behavior of connective and other soft tissue: Secondary | ICD-10-CM | POA: Insufficient documentation

## 2014-07-16 NOTE — Progress Notes (Signed)
Postoperative Followup Visit  HPI:  Claudia Berger is a 39 y.o. year old G39 initially seen in consultation on 06/04/2014 for recurrent cellular leiomyoma.  She then underwent an exploratory laparotomy, resection of right uterine fundal mass on 11/21/14 without complications.  Her postoperative course was uncomplicated.  Her final pathology revealed a 15 cm hydropic leiomyoma with focal leiomyomatosis close to the resection margin. There was no definitive intravascular leiomyomatosis..  She is seen today for a postoperative check and to discuss her pathology results and ongoing plan.  Since discharge from the hospital, she is feeling well.  She has improving appetite, normal bowel and bladder function, and pain controlled with minimal PO medication. She has no other complaints today.    Review of systems: Constitutional:  She has no weight gain or weight loss. She has no fever or chills. Eyes: No blurred vision Ears, Nose, Mouth, Throat: No dizziness, headaches or changes in hearing. No mouth sores. Cardiovascular: No chest pain, palpitations or edema. Respiratory:  No shortness of breath, wheezing or cough Gastrointestinal: She has normal bowel movements without diarrhea or constipation. She denies any nausea or vomiting. She denies blood in her stool or heart burn. Genitourinary:  She denies pelvic pain, pelvic pressure or changes in her urinary function. She has no hematuria, dysuria, or incontinence. She has no irregular vaginal bleeding or vaginal discharge Musculoskeletal: Denies muscle weakness or joint pains.  Skin:  She has no skin changes, rashes or itching Neurological:  Denies dizziness or headaches. No neuropathy, no numbness or tingling. Psychiatric:  She denies depression or anxiety. Hematologic/Lymphatic:   No easy bruising or bleeding   Physical Exam: Blood pressure 117/48, pulse 83, temperature 98.3 F (36.8 C), temperature source Oral, resp. rate 16, height 5\' 3"  (1.6 m),  weight 139 lb 3.2 oz (63.141 kg). General: Well dressed, well nourished in no apparent distress.   HEENT:  Normocephalic and atraumatic, no lesions.  Extraocular muscles intact. Sclerae anicteric. Pupils equal, round, reactive. No mouth sores or ulcers. Thyroid is normal size, not nodular, midline. Skin:  No lesions or rashes. Breasts:  deferred Lungs:  deferred Cardiovascular:  deferred Abdomen:  Soft, nontender, nondistended.  No palpable masses.  No hepatosplenomegaly.  No ascites. Normal bowel sounds.  No hernias.  Incision is in tact and healing well Genitourinary: deferred Extremities: No cyanosis, clubbing or edema.  No calf tenderness or erythema. No palpable cords. Psychiatric: Mood and affect are appropriate. Neurological: Awake, alert and oriented x 3. Sensation is intact, no neuropathy.  Musculoskeletal: No pain, normal strength and range of motion.  Assessment:    39 y.o. year old with recurrent hypocellular hydropic leiomyoma.   S/p expiratory laparotomy, resection of right uterine mass on 06/26/2014.   Plan: 1) Pathology reports reviewed today 2) Treatment counseling - no additional intervention is required at this time. Given the high risk for recurrence (which would be a second recurrence) of this lesion I am recommending that Claudia Berger undergo a completion hysterectomy after she is completed childbearing. I discussed this with Claudia Berger.  I am recommending annual surveillance ultrasounds to monitor for new growth of her current lesion in the interim so that this can be intervened on currently she is on regrow.  Due to the extensive nature of the resection of the myometrial mass I am recommending that Claudia Berger be delivered by cesarean section should she conceive a pregnancy. She was given the opportunity to ask questions, which were answered to her satisfaction, and she is agreement with  the above mentioned plan of care.  3)  Return to clinic prn Donaciano Eva, MD

## 2014-07-18 ENCOUNTER — Encounter (HOSPITAL_COMMUNITY): Payer: Self-pay

## 2014-12-18 ENCOUNTER — Ambulatory Visit (INDEPENDENT_AMBULATORY_CARE_PROVIDER_SITE_OTHER): Payer: BLUE CROSS/BLUE SHIELD | Admitting: Podiatry

## 2014-12-18 ENCOUNTER — Encounter: Payer: Self-pay | Admitting: Podiatry

## 2014-12-18 ENCOUNTER — Ambulatory Visit (INDEPENDENT_AMBULATORY_CARE_PROVIDER_SITE_OTHER): Payer: BLUE CROSS/BLUE SHIELD

## 2014-12-18 VITALS — BP 108/77 | HR 76 | Resp 16

## 2014-12-18 DIAGNOSIS — M2040 Other hammer toe(s) (acquired), unspecified foot: Secondary | ICD-10-CM | POA: Diagnosis not present

## 2014-12-18 DIAGNOSIS — M2011 Hallux valgus (acquired), right foot: Secondary | ICD-10-CM

## 2014-12-18 DIAGNOSIS — M779 Enthesopathy, unspecified: Secondary | ICD-10-CM

## 2014-12-18 MED ORDER — METHYLPREDNISOLONE 4 MG PO TBPK
ORAL_TABLET | ORAL | Status: DC
Start: 2014-12-18 — End: 2015-01-15

## 2014-12-18 MED ORDER — MELOXICAM 15 MG PO TABS: 15.0000 mg | ORAL_TABLET | Freq: Every day | ORAL | Status: DC

## 2014-12-18 NOTE — Progress Notes (Signed)
   Subjective:    Patient ID: Claudia Berger, female    DOB: [birth year], 39 y.o.   MRN: [id number]  HPI Comments: "I have pain under the toes"  Patient c/o aching plantar forefoot right for about 2 weeks. She has a callused area plantarly. Very painful with certain shoe(heels). Noticed that she is walking more to the side of her foot trying to compensate. No home treatment.     Review of Systems  Allergic/Immunologic: Positive for environmental allergies.  All other systems reviewed and are negative.      Objective:   Physical Exam: I have reviewed her past medical history medications allergies surgery social history and chief complaint and statement. Pulses are strongly palpable bilateral. Neurologic sensorium is intact per Semmes-Weinstein monofilament. Deep tendon reflexes are intact and brisk bilateral these are bilaterally symmetrical. Muscle strength +5 over 5 dorsiflexors and plantar flexors  inverters and everters all intrinsic musculature is intact. 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 The second toe is not rigid in nature and remains very flexible but she does have pain on end range of motion of the second metatarsophalangeal joint of the right foot. Radiograph confirms hallux abductovalgus deformity with an elongated second metatarsal resulting in her current complications.        Assessment & Plan:  Assessment: Capsulitis hallux abductovalgus. Capsulitis second metatarsophalangeal joint right foot.  Plan: Discussed etiology pathology conservative versus surgical therapies at this point we performed a dexamethasone injection with a Betadine skin prep. I injected 2 mg of dexamethasone directly into the second metatarsophalangeal joint with local unsteady. We discussed appropriate shoe gear stretching exercises ice therapy shoe  modifications. We discussed the etiology pathology conservative versus surgical therapies. I expressed to her that surgery will be in her future at some point. We did discuss the possible use of orthotics however her shoe gear currently does not allow for orthotic use. I wrote a prescription for Medrol Dosepak to be followed by meloxicam and I will follow-up with her in 1 month for evaluation.

## 2015-01-15 ENCOUNTER — Encounter: Payer: Self-pay | Admitting: Podiatry

## 2015-01-15 ENCOUNTER — Ambulatory Visit (INDEPENDENT_AMBULATORY_CARE_PROVIDER_SITE_OTHER): Payer: BLUE CROSS/BLUE SHIELD | Admitting: Podiatry

## 2015-01-15 VITALS — BP 109/76 | HR 91 | Resp 16

## 2015-01-15 DIAGNOSIS — M779 Enthesopathy, unspecified: Secondary | ICD-10-CM | POA: Diagnosis not present

## 2015-01-15 DIAGNOSIS — M2011 Hallux valgus (acquired), right foot: Secondary | ICD-10-CM

## 2015-01-15 NOTE — Progress Notes (Signed)
She presents today for follow-up of her capsulitis second metatarsophalangeal joints 1 month. She states that she began to feel better after the injection and after the sterilely. She states that she did not take the meloxicam because she was doing so much better. She states that she is approximately 70% improved.  Objective: Vital signs are stable she is alert and oriented 3 pulses are strongly palpable bilateral. Neurologic sensorium is intact per Semmes-Weinstein monofilament. Deep tendon reflexes intact bilateral and muscle strength +5 over 5 dorsiflexion plantar flexors and inverters everters all of his musculature is intact. 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 This is resulting in capsulitis of the second metatarsophalangeal joint. She understands this and is amenable to it. Cutaneous evaluation demonstrates supple well-hydrated cutis no erythema edema cellulitis drainage or odor.  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  Plan: Discussed etiology pathology conservative versus surgical therapies. I encouraged her to take the meloxicam 15 mg 1 by mouth daily. I also discussed the need for surgical intervention with her and we will consider this sometime over the holiday.  Roselind Messier DPM

## 2015-01-22 ENCOUNTER — Telehealth: Payer: Self-pay | Admitting: Nurse Practitioner

## 2015-01-22 NOTE — Telephone Encounter (Signed)
Patient calling to request her gyn onc records sent to PCP, Dr. Theadore Nan, and regular gyn, Dr. Brigid Re. Will send pending release of information documentation.

## 2015-05-18 ENCOUNTER — Other Ambulatory Visit: Payer: Self-pay | Admitting: Podiatry

## 2015-05-21 NOTE — Telephone Encounter (Signed)
Pt needs an appt prior to future refills. 

## 2015-06-29 ENCOUNTER — Other Ambulatory Visit: Payer: Self-pay | Admitting: Podiatry

## 2015-07-01 NOTE — Telephone Encounter (Signed)
Pt needs to schedule an appt if continuing to have problems. 

## 2015-08-20 ENCOUNTER — Other Ambulatory Visit: Payer: Self-pay | Admitting: Podiatry

## 2015-09-02 ENCOUNTER — Telehealth: Payer: Self-pay | Admitting: *Deleted

## 2015-09-02 MED ORDER — MELOXICAM 15 MG PO TABS: ORAL_TABLET | ORAL | Status: DC

## 2015-09-02 NOTE — Telephone Encounter (Signed)
Pt request refill of Meloxicam.  I told pt I would refill for 1 month, but if she was continuing to have a problem she needed to make an appt.  Pt states understanding and requested to be transferred to schedulers.

## 2015-09-17 ENCOUNTER — Ambulatory Visit: Payer: BLUE CROSS/BLUE SHIELD | Admitting: Podiatry

## 2015-10-03 ENCOUNTER — Ambulatory Visit: Payer: BLUE CROSS/BLUE SHIELD | Admitting: Podiatry

## 2015-10-08 ENCOUNTER — Encounter (INDEPENDENT_AMBULATORY_CARE_PROVIDER_SITE_OTHER): Payer: BLUE CROSS/BLUE SHIELD | Admitting: Podiatry

## 2015-10-08 ENCOUNTER — Ambulatory Visit: Payer: Self-pay

## 2015-10-08 DIAGNOSIS — M2010 Hallux valgus (acquired), unspecified foot: Secondary | ICD-10-CM

## 2015-10-08 NOTE — Progress Notes (Signed)
This encounter was created in error - please disregard.

## 2015-10-24 ENCOUNTER — Ambulatory Visit: Payer: BLUE CROSS/BLUE SHIELD | Admitting: Podiatry

## 2015-10-29 ENCOUNTER — Ambulatory Visit (INDEPENDENT_AMBULATORY_CARE_PROVIDER_SITE_OTHER): Payer: BLUE CROSS/BLUE SHIELD | Admitting: Podiatry

## 2015-10-29 ENCOUNTER — Encounter: Payer: Self-pay | Admitting: Podiatry

## 2015-10-29 VITALS — BP 105/67 | HR 66 | Resp 16

## 2015-10-29 DIAGNOSIS — M779 Enthesopathy, unspecified: Secondary | ICD-10-CM | POA: Diagnosis not present

## 2015-10-29 DIAGNOSIS — M2011 Hallux valgus (acquired), right foot: Secondary | ICD-10-CM | POA: Diagnosis not present

## 2015-10-29 MED ORDER — MELOXICAM 15 MG PO TABS: 15.0000 mg | ORAL_TABLET | Freq: Every day | ORAL | Status: DC

## 2015-10-30 NOTE — Progress Notes (Signed)
She presents today for follow-up of her bilateral bunion deformities. She states that the meloxicam seems to do the job that she takes it about every other day. She states that she has run out recently and has now been taking tumerac for inflammation. She states for the most part is all she takes the medicine her bunions on her.  Objective: Vital signs are stable she is alert and oriented 3 pulses remain palpable. She has still has tenderness on palpation of the first metatarsophalangeal joints and second MTPJ's bilaterally.  Assessment: Hallux valgus deformities with capsulitis.  Plan: A prescription for meloxicam 15 mg #90 with 3 refills. I will follow-up with her on an as-needed basis.

## 2015-12-11 DIAGNOSIS — F4323 Adjustment disorder with mixed anxiety and depressed mood: Secondary | ICD-10-CM | POA: Diagnosis not present

## 2016-03-13 DIAGNOSIS — J3089 Other allergic rhinitis: Secondary | ICD-10-CM | POA: Diagnosis not present

## 2016-03-13 DIAGNOSIS — J301 Allergic rhinitis due to pollen: Secondary | ICD-10-CM | POA: Diagnosis not present

## 2016-03-13 DIAGNOSIS — H1045 Other chronic allergic conjunctivitis: Secondary | ICD-10-CM | POA: Diagnosis not present

## 2016-03-13 DIAGNOSIS — J3081 Allergic rhinitis due to animal (cat) (dog) hair and dander: Secondary | ICD-10-CM | POA: Diagnosis not present

## 2016-03-19 DIAGNOSIS — F4323 Adjustment disorder with mixed anxiety and depressed mood: Secondary | ICD-10-CM | POA: Diagnosis not present

## 2016-04-06 DIAGNOSIS — Z3041 Encounter for surveillance of contraceptive pills: Secondary | ICD-10-CM | POA: Diagnosis not present

## 2016-04-06 DIAGNOSIS — B009 Herpesviral infection, unspecified: Secondary | ICD-10-CM | POA: Insufficient documentation

## 2016-04-06 DIAGNOSIS — Z8742 Personal history of other diseases of the female genital tract: Secondary | ICD-10-CM | POA: Insufficient documentation

## 2016-04-06 DIAGNOSIS — Z86018 Personal history of other benign neoplasm: Secondary | ICD-10-CM | POA: Insufficient documentation

## 2016-04-06 DIAGNOSIS — Z01419 Encounter for gynecological examination (general) (routine) without abnormal findings: Secondary | ICD-10-CM | POA: Diagnosis not present

## 2016-04-06 DIAGNOSIS — N766 Ulceration of vulva: Secondary | ICD-10-CM | POA: Diagnosis not present

## 2016-04-06 DIAGNOSIS — F3178 Bipolar disorder, in full remission, most recent episode mixed: Secondary | ICD-10-CM | POA: Insufficient documentation

## 2016-04-06 DIAGNOSIS — Z800 Family history of malignant neoplasm of digestive organs: Secondary | ICD-10-CM | POA: Insufficient documentation

## 2016-04-06 DIAGNOSIS — Z87898 Personal history of other specified conditions: Secondary | ICD-10-CM | POA: Diagnosis not present

## 2016-04-06 DIAGNOSIS — B373 Candidiasis of vulva and vagina: Secondary | ICD-10-CM | POA: Diagnosis not present

## 2016-04-13 DIAGNOSIS — F23 Brief psychotic disorder: Secondary | ICD-10-CM | POA: Diagnosis not present

## 2016-04-28 DIAGNOSIS — L814 Other melanin hyperpigmentation: Secondary | ICD-10-CM | POA: Diagnosis not present

## 2016-04-28 DIAGNOSIS — D1801 Hemangioma of skin and subcutaneous tissue: Secondary | ICD-10-CM | POA: Diagnosis not present

## 2016-04-28 DIAGNOSIS — D225 Melanocytic nevi of trunk: Secondary | ICD-10-CM | POA: Diagnosis not present

## 2016-04-28 DIAGNOSIS — B360 Pityriasis versicolor: Secondary | ICD-10-CM | POA: Diagnosis not present

## 2016-04-29 DIAGNOSIS — F23 Brief psychotic disorder: Secondary | ICD-10-CM | POA: Diagnosis not present

## 2016-06-16 DIAGNOSIS — F23 Brief psychotic disorder: Secondary | ICD-10-CM | POA: Diagnosis not present

## 2016-07-22 DIAGNOSIS — F23 Brief psychotic disorder: Secondary | ICD-10-CM | POA: Diagnosis not present

## 2016-09-09 DIAGNOSIS — J301 Allergic rhinitis due to pollen: Secondary | ICD-10-CM | POA: Diagnosis not present

## 2016-09-10 DIAGNOSIS — J3081 Allergic rhinitis due to animal (cat) (dog) hair and dander: Secondary | ICD-10-CM | POA: Diagnosis not present

## 2016-09-10 DIAGNOSIS — J3089 Other allergic rhinitis: Secondary | ICD-10-CM | POA: Diagnosis not present

## 2016-09-11 DIAGNOSIS — F4323 Adjustment disorder with mixed anxiety and depressed mood: Secondary | ICD-10-CM | POA: Diagnosis not present

## 2016-09-11 DIAGNOSIS — F22 Delusional disorders: Secondary | ICD-10-CM | POA: Diagnosis not present

## 2016-11-16 DIAGNOSIS — Z0000 Encounter for general adult medical examination without abnormal findings: Secondary | ICD-10-CM | POA: Diagnosis not present

## 2016-11-16 DIAGNOSIS — Z13220 Encounter for screening for lipoid disorders: Secondary | ICD-10-CM | POA: Diagnosis not present

## 2016-11-16 DIAGNOSIS — Z79899 Other long term (current) drug therapy: Secondary | ICD-10-CM | POA: Diagnosis not present

## 2016-12-28 DIAGNOSIS — F22 Delusional disorders: Secondary | ICD-10-CM | POA: Diagnosis not present

## 2017-01-19 DIAGNOSIS — F22 Delusional disorders: Secondary | ICD-10-CM | POA: Diagnosis not present

## 2017-03-04 DIAGNOSIS — J3081 Allergic rhinitis due to animal (cat) (dog) hair and dander: Secondary | ICD-10-CM | POA: Diagnosis not present

## 2017-03-04 DIAGNOSIS — H1045 Other chronic allergic conjunctivitis: Secondary | ICD-10-CM | POA: Diagnosis not present

## 2017-03-04 DIAGNOSIS — J3089 Other allergic rhinitis: Secondary | ICD-10-CM | POA: Diagnosis not present

## 2017-03-04 DIAGNOSIS — J301 Allergic rhinitis due to pollen: Secondary | ICD-10-CM | POA: Diagnosis not present

## 2017-04-02 DIAGNOSIS — J301 Allergic rhinitis due to pollen: Secondary | ICD-10-CM | POA: Diagnosis not present

## 2017-04-05 DIAGNOSIS — J3089 Other allergic rhinitis: Secondary | ICD-10-CM | POA: Diagnosis not present

## 2017-04-05 DIAGNOSIS — J3081 Allergic rhinitis due to animal (cat) (dog) hair and dander: Secondary | ICD-10-CM | POA: Diagnosis not present

## 2017-04-13 DIAGNOSIS — B009 Herpesviral infection, unspecified: Secondary | ICD-10-CM | POA: Diagnosis not present

## 2017-04-13 DIAGNOSIS — Z86018 Personal history of other benign neoplasm: Secondary | ICD-10-CM | POA: Diagnosis not present

## 2017-04-13 DIAGNOSIS — Z3041 Encounter for surveillance of contraceptive pills: Secondary | ICD-10-CM | POA: Diagnosis not present

## 2017-04-13 DIAGNOSIS — Z800 Family history of malignant neoplasm of digestive organs: Secondary | ICD-10-CM | POA: Diagnosis not present

## 2017-04-13 DIAGNOSIS — Z01419 Encounter for gynecological examination (general) (routine) without abnormal findings: Secondary | ICD-10-CM | POA: Diagnosis not present

## 2017-04-20 ENCOUNTER — Ambulatory Visit (INDEPENDENT_AMBULATORY_CARE_PROVIDER_SITE_OTHER): Payer: BLUE CROSS/BLUE SHIELD

## 2017-04-20 ENCOUNTER — Encounter: Payer: Self-pay | Admitting: Podiatry

## 2017-04-20 ENCOUNTER — Ambulatory Visit: Payer: BLUE CROSS/BLUE SHIELD | Admitting: Podiatry

## 2017-04-20 DIAGNOSIS — M2011 Hallux valgus (acquired), right foot: Secondary | ICD-10-CM

## 2017-04-21 NOTE — Progress Notes (Signed)
She presents today would like to discuss surgery once again.  She states that she would like to consider surgery in January but it plans to take a trip in late February early March to [place].  She is not sure that she is going have enough time to heal before successive trips in 2019.  Objective: Vital signs are stable she is alert and oriented x3.  Pulses remain palpable no major changes in physical exam she retains hallux abductovalgus deformity moderate deformity in severity very loose flexible first metatarsophalangeal joint contracted second metatarsophalangeal joint and toe.  Radiographs taken today do demonstrate an increase in the first intermetatarsal angle greater than normal value hallux abductus angle greater than normal value in the dorsally dislocated second toe with a long second metatarsal.  Assessment: Hallux abductovalgus deformity bilaterally right greater than left elongated second metatarsal right with hammertoe deformity.  Plan: We discussed in great detail today surgical options only.  At this point I feel in [facility].  With screw fixation second metatarsal osteotomy with screw fixation and a pinning of the toe in position with more than likely do well for her.  However, I do not feel that 3-4 weeks will be enough time to heal this before she leaves for [place].  I prefer her to do this at some point when she has more flexibility in her schedule.  I discussed this in great detail with her today and I will follow-up with her in August for a surgical consult for October.

## 2017-04-28 DIAGNOSIS — D225 Melanocytic nevi of trunk: Secondary | ICD-10-CM | POA: Diagnosis not present

## 2017-04-28 DIAGNOSIS — D1801 Hemangioma of skin and subcutaneous tissue: Secondary | ICD-10-CM | POA: Diagnosis not present

## 2017-04-28 DIAGNOSIS — L814 Other melanin hyperpigmentation: Secondary | ICD-10-CM | POA: Diagnosis not present

## 2017-04-28 DIAGNOSIS — Z23 Encounter for immunization: Secondary | ICD-10-CM | POA: Diagnosis not present

## 2017-05-04 DIAGNOSIS — J209 Acute bronchitis, unspecified: Secondary | ICD-10-CM | POA: Diagnosis not present

## 2017-09-07 ENCOUNTER — Telehealth: Payer: Self-pay | Admitting: *Deleted

## 2017-09-07 NOTE — Telephone Encounter (Signed)
"  I'm a patient of Dr. Milinda Pointer.  I'm trying to get a surgery scheduled in.  If you can give me a call back, I have some tentative dates I want to look at with you on the calendar.  I would appreciate it."

## 2017-09-10 NOTE — Telephone Encounter (Signed)
I attempted to return her call.  I left her a message to call me back. 

## 2017-10-12 DIAGNOSIS — F23 Brief psychotic disorder: Secondary | ICD-10-CM | POA: Diagnosis not present

## 2017-10-27 DIAGNOSIS — J3089 Other allergic rhinitis: Secondary | ICD-10-CM | POA: Diagnosis not present

## 2017-10-27 DIAGNOSIS — J3081 Allergic rhinitis due to animal (cat) (dog) hair and dander: Secondary | ICD-10-CM | POA: Diagnosis not present

## 2017-11-29 DIAGNOSIS — E781 Pure hyperglyceridemia: Secondary | ICD-10-CM | POA: Diagnosis not present

## 2017-11-29 DIAGNOSIS — Z79899 Other long term (current) drug therapy: Secondary | ICD-10-CM | POA: Diagnosis not present

## 2017-11-29 DIAGNOSIS — Z0000 Encounter for general adult medical examination without abnormal findings: Secondary | ICD-10-CM | POA: Diagnosis not present

## 2017-12-22 DIAGNOSIS — J301 Allergic rhinitis due to pollen: Secondary | ICD-10-CM | POA: Diagnosis not present

## 2017-12-22 DIAGNOSIS — H1045 Other chronic allergic conjunctivitis: Secondary | ICD-10-CM | POA: Diagnosis not present

## 2017-12-22 DIAGNOSIS — J3089 Other allergic rhinitis: Secondary | ICD-10-CM | POA: Diagnosis not present

## 2017-12-22 DIAGNOSIS — J3081 Allergic rhinitis due to animal (cat) (dog) hair and dander: Secondary | ICD-10-CM | POA: Diagnosis not present

## 2017-12-24 DIAGNOSIS — J301 Allergic rhinitis due to pollen: Secondary | ICD-10-CM | POA: Diagnosis not present

## 2018-04-20 DIAGNOSIS — L814 Other melanin hyperpigmentation: Secondary | ICD-10-CM | POA: Diagnosis not present

## 2018-04-20 DIAGNOSIS — D225 Melanocytic nevi of trunk: Secondary | ICD-10-CM | POA: Diagnosis not present

## 2018-05-09 DIAGNOSIS — J3089 Other allergic rhinitis: Secondary | ICD-10-CM | POA: Diagnosis not present

## 2018-05-09 DIAGNOSIS — J3081 Allergic rhinitis due to animal (cat) (dog) hair and dander: Secondary | ICD-10-CM | POA: Diagnosis not present

## 2018-05-16 DIAGNOSIS — B373 Candidiasis of vulva and vagina: Secondary | ICD-10-CM | POA: Diagnosis not present

## 2018-05-16 DIAGNOSIS — Z6828 Body mass index (BMI) 28.0-28.9, adult: Secondary | ICD-10-CM | POA: Diagnosis not present

## 2018-05-16 DIAGNOSIS — B009 Herpesviral infection, unspecified: Secondary | ICD-10-CM | POA: Diagnosis not present

## 2018-05-16 DIAGNOSIS — Z800 Family history of malignant neoplasm of digestive organs: Secondary | ICD-10-CM | POA: Diagnosis not present

## 2018-05-16 DIAGNOSIS — Z86018 Personal history of other benign neoplasm: Secondary | ICD-10-CM | POA: Diagnosis not present

## 2018-05-16 DIAGNOSIS — Z01411 Encounter for gynecological examination (general) (routine) with abnormal findings: Secondary | ICD-10-CM | POA: Diagnosis not present

## 2018-05-16 DIAGNOSIS — E663 Overweight: Secondary | ICD-10-CM | POA: Diagnosis not present

## 2018-06-27 DIAGNOSIS — J301 Allergic rhinitis due to pollen: Secondary | ICD-10-CM | POA: Diagnosis not present

## 2018-09-07 DIAGNOSIS — J3081 Allergic rhinitis due to animal (cat) (dog) hair and dander: Secondary | ICD-10-CM | POA: Diagnosis not present

## 2018-09-07 DIAGNOSIS — J301 Allergic rhinitis due to pollen: Secondary | ICD-10-CM | POA: Diagnosis not present

## 2018-09-07 DIAGNOSIS — J3089 Other allergic rhinitis: Secondary | ICD-10-CM | POA: Diagnosis not present

## 2018-09-09 DIAGNOSIS — J301 Allergic rhinitis due to pollen: Secondary | ICD-10-CM | POA: Diagnosis not present

## 2018-09-09 DIAGNOSIS — J3081 Allergic rhinitis due to animal (cat) (dog) hair and dander: Secondary | ICD-10-CM | POA: Diagnosis not present

## 2018-09-09 DIAGNOSIS — J3089 Other allergic rhinitis: Secondary | ICD-10-CM | POA: Diagnosis not present

## 2018-09-12 DIAGNOSIS — J301 Allergic rhinitis due to pollen: Secondary | ICD-10-CM | POA: Diagnosis not present

## 2018-09-12 DIAGNOSIS — J3089 Other allergic rhinitis: Secondary | ICD-10-CM | POA: Diagnosis not present

## 2018-09-12 DIAGNOSIS — J3081 Allergic rhinitis due to animal (cat) (dog) hair and dander: Secondary | ICD-10-CM | POA: Diagnosis not present

## 2018-09-14 DIAGNOSIS — J301 Allergic rhinitis due to pollen: Secondary | ICD-10-CM | POA: Diagnosis not present

## 2018-09-21 DIAGNOSIS — J301 Allergic rhinitis due to pollen: Secondary | ICD-10-CM | POA: Diagnosis not present

## 2018-09-21 DIAGNOSIS — J3089 Other allergic rhinitis: Secondary | ICD-10-CM | POA: Diagnosis not present

## 2018-09-21 DIAGNOSIS — J3081 Allergic rhinitis due to animal (cat) (dog) hair and dander: Secondary | ICD-10-CM | POA: Diagnosis not present

## 2018-09-28 DIAGNOSIS — J3089 Other allergic rhinitis: Secondary | ICD-10-CM | POA: Diagnosis not present

## 2018-09-28 DIAGNOSIS — J3081 Allergic rhinitis due to animal (cat) (dog) hair and dander: Secondary | ICD-10-CM | POA: Diagnosis not present

## 2018-09-28 DIAGNOSIS — J301 Allergic rhinitis due to pollen: Secondary | ICD-10-CM | POA: Diagnosis not present

## 2018-10-04 DIAGNOSIS — F22 Delusional disorders: Secondary | ICD-10-CM | POA: Diagnosis not present

## 2018-10-05 DIAGNOSIS — J301 Allergic rhinitis due to pollen: Secondary | ICD-10-CM | POA: Diagnosis not present

## 2018-10-05 DIAGNOSIS — J3081 Allergic rhinitis due to animal (cat) (dog) hair and dander: Secondary | ICD-10-CM | POA: Diagnosis not present

## 2018-10-05 DIAGNOSIS — J3089 Other allergic rhinitis: Secondary | ICD-10-CM | POA: Diagnosis not present

## 2018-10-12 DIAGNOSIS — J301 Allergic rhinitis due to pollen: Secondary | ICD-10-CM | POA: Diagnosis not present

## 2018-10-12 DIAGNOSIS — J3081 Allergic rhinitis due to animal (cat) (dog) hair and dander: Secondary | ICD-10-CM | POA: Diagnosis not present

## 2018-10-12 DIAGNOSIS — J3089 Other allergic rhinitis: Secondary | ICD-10-CM | POA: Diagnosis not present

## 2018-10-26 DIAGNOSIS — J301 Allergic rhinitis due to pollen: Secondary | ICD-10-CM | POA: Diagnosis not present

## 2018-10-26 DIAGNOSIS — J3081 Allergic rhinitis due to animal (cat) (dog) hair and dander: Secondary | ICD-10-CM | POA: Diagnosis not present

## 2018-10-26 DIAGNOSIS — J3089 Other allergic rhinitis: Secondary | ICD-10-CM | POA: Diagnosis not present

## 2018-11-02 DIAGNOSIS — J3089 Other allergic rhinitis: Secondary | ICD-10-CM | POA: Diagnosis not present

## 2018-11-02 DIAGNOSIS — J3081 Allergic rhinitis due to animal (cat) (dog) hair and dander: Secondary | ICD-10-CM | POA: Diagnosis not present

## 2018-11-02 DIAGNOSIS — J301 Allergic rhinitis due to pollen: Secondary | ICD-10-CM | POA: Diagnosis not present

## 2018-11-09 DIAGNOSIS — J3081 Allergic rhinitis due to animal (cat) (dog) hair and dander: Secondary | ICD-10-CM | POA: Diagnosis not present

## 2018-11-09 DIAGNOSIS — J301 Allergic rhinitis due to pollen: Secondary | ICD-10-CM | POA: Diagnosis not present

## 2018-11-09 DIAGNOSIS — J3089 Other allergic rhinitis: Secondary | ICD-10-CM | POA: Diagnosis not present

## 2018-11-16 DIAGNOSIS — J301 Allergic rhinitis due to pollen: Secondary | ICD-10-CM | POA: Diagnosis not present

## 2018-11-16 DIAGNOSIS — J3081 Allergic rhinitis due to animal (cat) (dog) hair and dander: Secondary | ICD-10-CM | POA: Diagnosis not present

## 2018-11-16 DIAGNOSIS — J3089 Other allergic rhinitis: Secondary | ICD-10-CM | POA: Diagnosis not present

## 2018-11-24 DIAGNOSIS — J3081 Allergic rhinitis due to animal (cat) (dog) hair and dander: Secondary | ICD-10-CM | POA: Diagnosis not present

## 2018-11-24 DIAGNOSIS — J3089 Other allergic rhinitis: Secondary | ICD-10-CM | POA: Diagnosis not present

## 2018-11-24 DIAGNOSIS — J301 Allergic rhinitis due to pollen: Secondary | ICD-10-CM | POA: Diagnosis not present

## 2018-12-07 DIAGNOSIS — J3089 Other allergic rhinitis: Secondary | ICD-10-CM | POA: Diagnosis not present

## 2018-12-07 DIAGNOSIS — J301 Allergic rhinitis due to pollen: Secondary | ICD-10-CM | POA: Diagnosis not present

## 2018-12-07 DIAGNOSIS — J3081 Allergic rhinitis due to animal (cat) (dog) hair and dander: Secondary | ICD-10-CM | POA: Diagnosis not present

## 2018-12-09 DIAGNOSIS — J3089 Other allergic rhinitis: Secondary | ICD-10-CM | POA: Diagnosis not present

## 2018-12-09 DIAGNOSIS — J3081 Allergic rhinitis due to animal (cat) (dog) hair and dander: Secondary | ICD-10-CM | POA: Diagnosis not present

## 2018-12-14 DIAGNOSIS — J3089 Other allergic rhinitis: Secondary | ICD-10-CM | POA: Diagnosis not present

## 2018-12-14 DIAGNOSIS — J301 Allergic rhinitis due to pollen: Secondary | ICD-10-CM | POA: Diagnosis not present

## 2018-12-14 DIAGNOSIS — J3081 Allergic rhinitis due to animal (cat) (dog) hair and dander: Secondary | ICD-10-CM | POA: Diagnosis not present

## 2018-12-21 DIAGNOSIS — J3089 Other allergic rhinitis: Secondary | ICD-10-CM | POA: Diagnosis not present

## 2018-12-21 DIAGNOSIS — J3081 Allergic rhinitis due to animal (cat) (dog) hair and dander: Secondary | ICD-10-CM | POA: Diagnosis not present

## 2018-12-21 DIAGNOSIS — J301 Allergic rhinitis due to pollen: Secondary | ICD-10-CM | POA: Diagnosis not present

## 2018-12-23 ENCOUNTER — Other Ambulatory Visit: Payer: Self-pay | Admitting: Obstetrics and Gynecology

## 2018-12-23 DIAGNOSIS — Z1231 Encounter for screening mammogram for malignant neoplasm of breast: Secondary | ICD-10-CM

## 2018-12-23 DIAGNOSIS — Z0000 Encounter for general adult medical examination without abnormal findings: Secondary | ICD-10-CM | POA: Diagnosis not present

## 2018-12-30 DIAGNOSIS — J301 Allergic rhinitis due to pollen: Secondary | ICD-10-CM | POA: Diagnosis not present

## 2018-12-30 DIAGNOSIS — J3089 Other allergic rhinitis: Secondary | ICD-10-CM | POA: Diagnosis not present

## 2018-12-30 DIAGNOSIS — J3081 Allergic rhinitis due to animal (cat) (dog) hair and dander: Secondary | ICD-10-CM | POA: Diagnosis not present

## 2018-12-30 DIAGNOSIS — H1045 Other chronic allergic conjunctivitis: Secondary | ICD-10-CM | POA: Diagnosis not present

## 2019-01-09 DIAGNOSIS — J301 Allergic rhinitis due to pollen: Secondary | ICD-10-CM | POA: Diagnosis not present

## 2019-01-09 DIAGNOSIS — J3089 Other allergic rhinitis: Secondary | ICD-10-CM | POA: Diagnosis not present

## 2019-01-09 DIAGNOSIS — J3081 Allergic rhinitis due to animal (cat) (dog) hair and dander: Secondary | ICD-10-CM | POA: Diagnosis not present

## 2019-01-19 DIAGNOSIS — J3081 Allergic rhinitis due to animal (cat) (dog) hair and dander: Secondary | ICD-10-CM | POA: Diagnosis not present

## 2019-01-19 DIAGNOSIS — J301 Allergic rhinitis due to pollen: Secondary | ICD-10-CM | POA: Diagnosis not present

## 2019-01-19 DIAGNOSIS — J3089 Other allergic rhinitis: Secondary | ICD-10-CM | POA: Diagnosis not present

## 2019-02-03 DIAGNOSIS — J3081 Allergic rhinitis due to animal (cat) (dog) hair and dander: Secondary | ICD-10-CM | POA: Diagnosis not present

## 2019-02-03 DIAGNOSIS — J3089 Other allergic rhinitis: Secondary | ICD-10-CM | POA: Diagnosis not present

## 2019-02-03 DIAGNOSIS — J301 Allergic rhinitis due to pollen: Secondary | ICD-10-CM | POA: Diagnosis not present

## 2019-02-08 ENCOUNTER — Ambulatory Visit
Admission: RE | Admit: 2019-02-08 | Discharge: 2019-02-08 | Disposition: A | Discharge status: Home / Self Care | Payer: BC Managed Care – PPO | Source: Ambulatory Visit | Attending: Obstetrics and Gynecology | Admitting: Obstetrics and Gynecology

## 2019-02-08 ENCOUNTER — Other Ambulatory Visit: Payer: Self-pay

## 2019-02-08 DIAGNOSIS — Z1231 Encounter for screening mammogram for malignant neoplasm of breast: Secondary | ICD-10-CM | POA: Diagnosis not present

## 2019-02-10 DIAGNOSIS — J3089 Other allergic rhinitis: Secondary | ICD-10-CM | POA: Diagnosis not present

## 2019-02-10 DIAGNOSIS — J301 Allergic rhinitis due to pollen: Secondary | ICD-10-CM | POA: Diagnosis not present

## 2019-02-10 DIAGNOSIS — J3081 Allergic rhinitis due to animal (cat) (dog) hair and dander: Secondary | ICD-10-CM | POA: Diagnosis not present

## 2019-02-27 DIAGNOSIS — J3089 Other allergic rhinitis: Secondary | ICD-10-CM | POA: Diagnosis not present

## 2019-02-27 DIAGNOSIS — J3081 Allergic rhinitis due to animal (cat) (dog) hair and dander: Secondary | ICD-10-CM | POA: Diagnosis not present

## 2019-02-27 DIAGNOSIS — J301 Allergic rhinitis due to pollen: Secondary | ICD-10-CM | POA: Diagnosis not present

## 2019-03-06 DIAGNOSIS — J3081 Allergic rhinitis due to animal (cat) (dog) hair and dander: Secondary | ICD-10-CM | POA: Diagnosis not present

## 2019-03-06 DIAGNOSIS — J301 Allergic rhinitis due to pollen: Secondary | ICD-10-CM | POA: Diagnosis not present

## 2019-03-06 DIAGNOSIS — J3089 Other allergic rhinitis: Secondary | ICD-10-CM | POA: Diagnosis not present

## 2019-04-03 DIAGNOSIS — J3089 Other allergic rhinitis: Secondary | ICD-10-CM | POA: Diagnosis not present

## 2019-04-03 DIAGNOSIS — J301 Allergic rhinitis due to pollen: Secondary | ICD-10-CM | POA: Diagnosis not present

## 2019-04-03 DIAGNOSIS — J3081 Allergic rhinitis due to animal (cat) (dog) hair and dander: Secondary | ICD-10-CM | POA: Diagnosis not present

## 2019-04-10 DIAGNOSIS — J3089 Other allergic rhinitis: Secondary | ICD-10-CM | POA: Diagnosis not present

## 2019-04-10 DIAGNOSIS — J301 Allergic rhinitis due to pollen: Secondary | ICD-10-CM | POA: Diagnosis not present

## 2019-04-10 DIAGNOSIS — J3081 Allergic rhinitis due to animal (cat) (dog) hair and dander: Secondary | ICD-10-CM | POA: Diagnosis not present

## 2019-04-24 DIAGNOSIS — J3081 Allergic rhinitis due to animal (cat) (dog) hair and dander: Secondary | ICD-10-CM | POA: Diagnosis not present

## 2019-04-24 DIAGNOSIS — J301 Allergic rhinitis due to pollen: Secondary | ICD-10-CM | POA: Diagnosis not present

## 2019-04-24 DIAGNOSIS — J3089 Other allergic rhinitis: Secondary | ICD-10-CM | POA: Diagnosis not present

## 2019-05-01 DIAGNOSIS — L814 Other melanin hyperpigmentation: Secondary | ICD-10-CM | POA: Diagnosis not present

## 2019-05-01 DIAGNOSIS — D225 Melanocytic nevi of trunk: Secondary | ICD-10-CM | POA: Diagnosis not present

## 2019-05-01 DIAGNOSIS — L719 Rosacea, unspecified: Secondary | ICD-10-CM | POA: Diagnosis not present

## 2019-05-31 DIAGNOSIS — Z86018 Personal history of other benign neoplasm: Secondary | ICD-10-CM | POA: Diagnosis not present

## 2019-05-31 DIAGNOSIS — Z800 Family history of malignant neoplasm of digestive organs: Secondary | ICD-10-CM | POA: Diagnosis not present

## 2019-05-31 DIAGNOSIS — Z01419 Encounter for gynecological examination (general) (routine) without abnormal findings: Secondary | ICD-10-CM | POA: Diagnosis not present

## 2019-05-31 DIAGNOSIS — Z87410 Personal history of cervical dysplasia: Secondary | ICD-10-CM | POA: Diagnosis not present

## 2019-05-31 DIAGNOSIS — Z1151 Encounter for screening for human papillomavirus (HPV): Secondary | ICD-10-CM | POA: Diagnosis not present

## 2019-05-31 DIAGNOSIS — Z9889 Other specified postprocedural states: Secondary | ICD-10-CM | POA: Diagnosis not present

## 2019-05-31 DIAGNOSIS — Z01411 Encounter for gynecological examination (general) (routine) with abnormal findings: Secondary | ICD-10-CM | POA: Diagnosis not present

## 2019-05-31 DIAGNOSIS — B373 Candidiasis of vulva and vagina: Secondary | ICD-10-CM | POA: Diagnosis not present

## 2019-06-21 DIAGNOSIS — J301 Allergic rhinitis due to pollen: Secondary | ICD-10-CM | POA: Diagnosis not present

## 2019-06-22 DIAGNOSIS — J3089 Other allergic rhinitis: Secondary | ICD-10-CM | POA: Diagnosis not present

## 2019-06-22 DIAGNOSIS — J3081 Allergic rhinitis due to animal (cat) (dog) hair and dander: Secondary | ICD-10-CM | POA: Diagnosis not present

## 2019-06-26 DIAGNOSIS — L900 Lichen sclerosus et atrophicus: Secondary | ICD-10-CM | POA: Diagnosis not present

## 2019-06-26 DIAGNOSIS — N9089 Other specified noninflammatory disorders of vulva and perineum: Secondary | ICD-10-CM | POA: Diagnosis not present

## 2019-07-18 DIAGNOSIS — F22 Delusional disorders: Secondary | ICD-10-CM | POA: Diagnosis not present

## 2019-07-18 DIAGNOSIS — F4322 Adjustment disorder with anxiety: Secondary | ICD-10-CM | POA: Diagnosis not present

## 2019-07-23 ENCOUNTER — Ambulatory Visit: Payer: BC Managed Care – PPO | Attending: Internal Medicine

## 2019-07-23 DIAGNOSIS — Z23 Encounter for immunization: Secondary | ICD-10-CM

## 2019-07-23 NOTE — Progress Notes (Signed)
   Covid-19 Vaccination Clinic  Name:  Claudia Berger    MRN: [id number] DOB: [birth year]  07/23/2019  Claudia Berger was observed post Covid-19 immunization for 15 minutes without incident. She was provided with Vaccine Information Sheet and instruction to access the V-Safe system.   Claudia Berger was instructed to call 911 with any severe reactions post vaccine: Marland Kitchen Difficulty breathing  . Swelling of face and throat  . A fast heartbeat  . A bad rash all over body  . Dizziness and weakness   Immunizations Administered    Name Date Dose VIS Date Route   Pfizer COVID-19 Vaccine 07/23/2019  3:20 PM 0.3 mL 04/28/2019 Intramuscular   Manufacturer: [place]   Lot: [id number]   [place]: [id number]

## 2019-07-25 DIAGNOSIS — J3081 Allergic rhinitis due to animal (cat) (dog) hair and dander: Secondary | ICD-10-CM | POA: Diagnosis not present

## 2019-07-25 DIAGNOSIS — J301 Allergic rhinitis due to pollen: Secondary | ICD-10-CM | POA: Diagnosis not present

## 2019-07-25 DIAGNOSIS — J3089 Other allergic rhinitis: Secondary | ICD-10-CM | POA: Diagnosis not present

## 2019-08-04 DIAGNOSIS — J3089 Other allergic rhinitis: Secondary | ICD-10-CM | POA: Diagnosis not present

## 2019-08-04 DIAGNOSIS — J301 Allergic rhinitis due to pollen: Secondary | ICD-10-CM | POA: Diagnosis not present

## 2019-08-04 DIAGNOSIS — J3081 Allergic rhinitis due to animal (cat) (dog) hair and dander: Secondary | ICD-10-CM | POA: Diagnosis not present

## 2019-08-18 DIAGNOSIS — F22 Delusional disorders: Secondary | ICD-10-CM | POA: Diagnosis not present

## 2019-08-23 ENCOUNTER — Ambulatory Visit: Payer: BC Managed Care – PPO | Attending: Internal Medicine

## 2019-08-23 DIAGNOSIS — Z23 Encounter for immunization: Secondary | ICD-10-CM

## 2019-08-23 NOTE — Progress Notes (Signed)
2  Covid-19 Vaccination Clinic  Name:  Tal Friloux    MRN: [id number] DOB: 05-Aug-1975  08/23/2019  Ms. Pinera was observed post Covid-19 immunization for 15 minutes without incident. She was provided with Vaccine Information Sheet and instruction to access the V-Safe system.   Ms. Lowery was instructed to call 911 with any severe reactions post vaccine: Marland Kitchen Difficulty breathing  . Swelling of face and throat  . A fast heartbeat  . A bad rash all over body  . Dizziness and weakness   Immunizations Administered    Name Date Dose VIS Date Route   Pfizer COVID-19 Vaccine 08/23/2019  4:19 PM 0.3 mL 04/28/2019 Intramuscular   Manufacturer: [employer]   Lot: [id number]   [place]: [id number]

## 2019-09-19 DIAGNOSIS — F22 Delusional disorders: Secondary | ICD-10-CM | POA: Diagnosis not present

## 2019-09-20 DIAGNOSIS — J3081 Allergic rhinitis due to animal (cat) (dog) hair and dander: Secondary | ICD-10-CM | POA: Diagnosis not present

## 2019-09-20 DIAGNOSIS — J3089 Other allergic rhinitis: Secondary | ICD-10-CM | POA: Diagnosis not present

## 2019-09-20 DIAGNOSIS — J301 Allergic rhinitis due to pollen: Secondary | ICD-10-CM | POA: Diagnosis not present

## 2019-09-26 DIAGNOSIS — J301 Allergic rhinitis due to pollen: Secondary | ICD-10-CM | POA: Diagnosis not present

## 2019-09-26 DIAGNOSIS — J3089 Other allergic rhinitis: Secondary | ICD-10-CM | POA: Diagnosis not present

## 2019-09-26 DIAGNOSIS — J3081 Allergic rhinitis due to animal (cat) (dog) hair and dander: Secondary | ICD-10-CM | POA: Diagnosis not present

## 2019-10-03 DIAGNOSIS — J301 Allergic rhinitis due to pollen: Secondary | ICD-10-CM | POA: Diagnosis not present

## 2019-10-03 DIAGNOSIS — J3089 Other allergic rhinitis: Secondary | ICD-10-CM | POA: Diagnosis not present

## 2019-10-03 DIAGNOSIS — J3081 Allergic rhinitis due to animal (cat) (dog) hair and dander: Secondary | ICD-10-CM | POA: Diagnosis not present

## 2019-10-06 DIAGNOSIS — J3089 Other allergic rhinitis: Secondary | ICD-10-CM | POA: Diagnosis not present

## 2019-10-06 DIAGNOSIS — J3081 Allergic rhinitis due to animal (cat) (dog) hair and dander: Secondary | ICD-10-CM | POA: Diagnosis not present

## 2019-10-06 DIAGNOSIS — J301 Allergic rhinitis due to pollen: Secondary | ICD-10-CM | POA: Diagnosis not present

## 2019-10-10 DIAGNOSIS — J301 Allergic rhinitis due to pollen: Secondary | ICD-10-CM | POA: Diagnosis not present

## 2019-10-10 DIAGNOSIS — J3089 Other allergic rhinitis: Secondary | ICD-10-CM | POA: Diagnosis not present

## 2019-10-10 DIAGNOSIS — J3081 Allergic rhinitis due to animal (cat) (dog) hair and dander: Secondary | ICD-10-CM | POA: Diagnosis not present

## 2019-10-13 DIAGNOSIS — J3081 Allergic rhinitis due to animal (cat) (dog) hair and dander: Secondary | ICD-10-CM | POA: Diagnosis not present

## 2019-10-13 DIAGNOSIS — J3089 Other allergic rhinitis: Secondary | ICD-10-CM | POA: Diagnosis not present

## 2019-10-13 DIAGNOSIS — J301 Allergic rhinitis due to pollen: Secondary | ICD-10-CM | POA: Diagnosis not present

## 2019-10-18 DIAGNOSIS — J3089 Other allergic rhinitis: Secondary | ICD-10-CM | POA: Diagnosis not present

## 2019-10-18 DIAGNOSIS — J3081 Allergic rhinitis due to animal (cat) (dog) hair and dander: Secondary | ICD-10-CM | POA: Diagnosis not present

## 2019-10-18 DIAGNOSIS — J301 Allergic rhinitis due to pollen: Secondary | ICD-10-CM | POA: Diagnosis not present

## 2019-11-27 DIAGNOSIS — J3089 Other allergic rhinitis: Secondary | ICD-10-CM | POA: Diagnosis not present

## 2019-11-27 DIAGNOSIS — J301 Allergic rhinitis due to pollen: Secondary | ICD-10-CM | POA: Diagnosis not present

## 2019-11-27 DIAGNOSIS — J3081 Allergic rhinitis due to animal (cat) (dog) hair and dander: Secondary | ICD-10-CM | POA: Diagnosis not present

## 2019-12-18 DIAGNOSIS — J301 Allergic rhinitis due to pollen: Secondary | ICD-10-CM | POA: Diagnosis not present

## 2019-12-18 DIAGNOSIS — J3089 Other allergic rhinitis: Secondary | ICD-10-CM | POA: Diagnosis not present

## 2019-12-18 DIAGNOSIS — J3081 Allergic rhinitis due to animal (cat) (dog) hair and dander: Secondary | ICD-10-CM | POA: Diagnosis not present

## 2019-12-20 DIAGNOSIS — E781 Pure hyperglyceridemia: Secondary | ICD-10-CM | POA: Diagnosis not present

## 2019-12-20 DIAGNOSIS — Z130 Encounter for screening for diseases of the blood and blood-forming organs and certain disorders involving the immune mechanism: Secondary | ICD-10-CM | POA: Diagnosis not present

## 2019-12-25 DIAGNOSIS — J301 Allergic rhinitis due to pollen: Secondary | ICD-10-CM | POA: Diagnosis not present

## 2019-12-25 DIAGNOSIS — J3089 Other allergic rhinitis: Secondary | ICD-10-CM | POA: Diagnosis not present

## 2019-12-25 DIAGNOSIS — Z0000 Encounter for general adult medical examination without abnormal findings: Secondary | ICD-10-CM | POA: Diagnosis not present

## 2019-12-25 DIAGNOSIS — J3081 Allergic rhinitis due to animal (cat) (dog) hair and dander: Secondary | ICD-10-CM | POA: Diagnosis not present

## 2020-01-23 ENCOUNTER — Other Ambulatory Visit: Payer: Self-pay | Admitting: Obstetrics and Gynecology

## 2020-01-23 DIAGNOSIS — Z1231 Encounter for screening mammogram for malignant neoplasm of breast: Secondary | ICD-10-CM

## 2020-02-12 ENCOUNTER — Ambulatory Visit: Payer: BC Managed Care – PPO

## 2020-03-13 DIAGNOSIS — J3089 Other allergic rhinitis: Secondary | ICD-10-CM | POA: Diagnosis not present

## 2020-03-13 DIAGNOSIS — J3081 Allergic rhinitis due to animal (cat) (dog) hair and dander: Secondary | ICD-10-CM | POA: Diagnosis not present

## 2020-03-13 DIAGNOSIS — H1045 Other chronic allergic conjunctivitis: Secondary | ICD-10-CM | POA: Diagnosis not present

## 2020-03-13 DIAGNOSIS — J301 Allergic rhinitis due to pollen: Secondary | ICD-10-CM | POA: Diagnosis not present

## 2020-04-03 DIAGNOSIS — Z1231 Encounter for screening mammogram for malignant neoplasm of breast: Secondary | ICD-10-CM | POA: Diagnosis not present

## 2020-04-20 DIAGNOSIS — F411 Generalized anxiety disorder: Secondary | ICD-10-CM | POA: Diagnosis not present

## 2020-04-20 DIAGNOSIS — F22 Delusional disorders: Secondary | ICD-10-CM | POA: Diagnosis not present

## 2020-05-22 DIAGNOSIS — L821 Other seborrheic keratosis: Secondary | ICD-10-CM | POA: Diagnosis not present

## 2020-05-22 DIAGNOSIS — D225 Melanocytic nevi of trunk: Secondary | ICD-10-CM | POA: Diagnosis not present

## 2020-05-22 DIAGNOSIS — L814 Other melanin hyperpigmentation: Secondary | ICD-10-CM | POA: Diagnosis not present

## 2020-05-22 DIAGNOSIS — L578 Other skin changes due to chronic exposure to nonionizing radiation: Secondary | ICD-10-CM | POA: Diagnosis not present

## 2020-05-29 DIAGNOSIS — Z1152 Encounter for screening for COVID-19: Secondary | ICD-10-CM | POA: Diagnosis not present

## 2020-07-01 DIAGNOSIS — B009 Herpesviral infection, unspecified: Secondary | ICD-10-CM | POA: Diagnosis not present

## 2020-07-01 DIAGNOSIS — Z9889 Other specified postprocedural states: Secondary | ICD-10-CM | POA: Diagnosis not present

## 2020-07-01 DIAGNOSIS — Z01419 Encounter for gynecological examination (general) (routine) without abnormal findings: Secondary | ICD-10-CM | POA: Diagnosis not present

## 2020-07-01 DIAGNOSIS — Z8742 Personal history of other diseases of the female genital tract: Secondary | ICD-10-CM | POA: Diagnosis not present

## 2020-07-01 DIAGNOSIS — Z800 Family history of malignant neoplasm of digestive organs: Secondary | ICD-10-CM | POA: Diagnosis not present

## 2020-10-18 DIAGNOSIS — F22 Delusional disorders: Secondary | ICD-10-CM | POA: Diagnosis not present

## 2020-12-25 DIAGNOSIS — E781 Pure hyperglyceridemia: Secondary | ICD-10-CM | POA: Diagnosis not present

## 2020-12-25 DIAGNOSIS — Z131 Encounter for screening for diabetes mellitus: Secondary | ICD-10-CM | POA: Diagnosis not present

## 2020-12-27 DIAGNOSIS — Z0000 Encounter for general adult medical examination without abnormal findings: Secondary | ICD-10-CM | POA: Diagnosis not present

## 2020-12-27 DIAGNOSIS — Z23 Encounter for immunization: Secondary | ICD-10-CM | POA: Diagnosis not present

## 2021-02-15 IMAGING — MG MM DIGITAL SCREENING BILAT W/ TOMO W/ CAD
6 of 12 series · 6 of 36 positions shown · non-contrast
Comparison: Previous exam(s).

CLINICAL DATA: Screening.

EXAM:
DIGITAL SCREENING BILATERAL MAMMOGRAM WITH TOMO AND CAD

[L MLO synth-2D (1 of 2)]
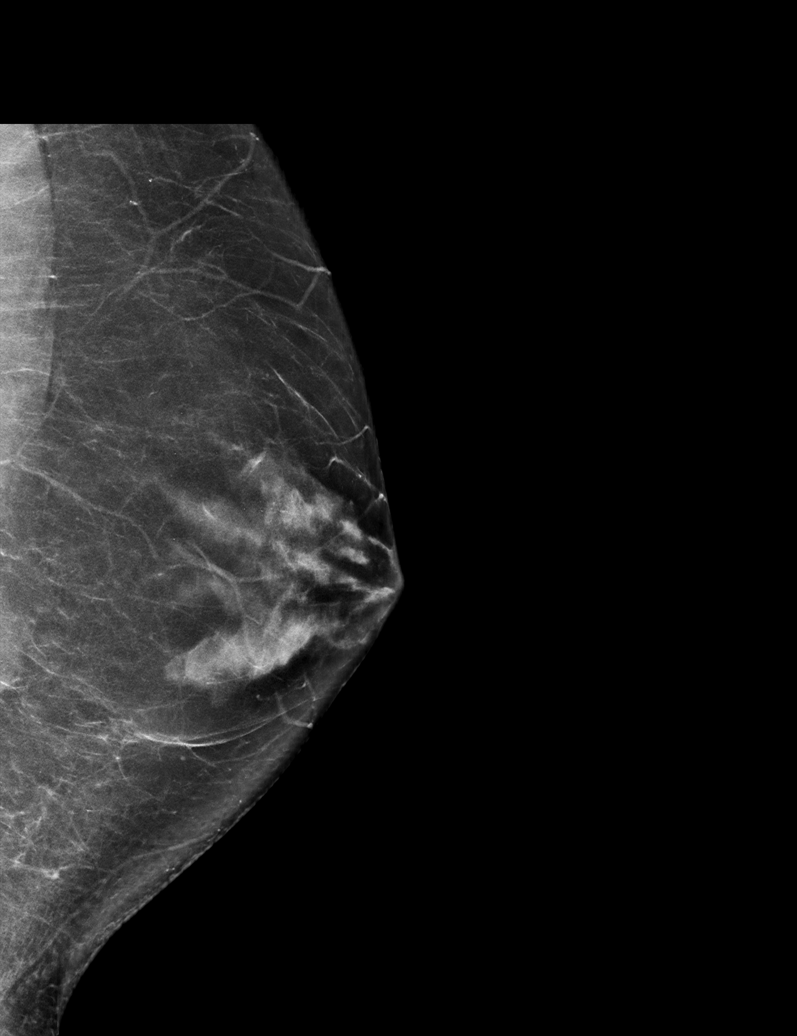

[R CC synth-2D]
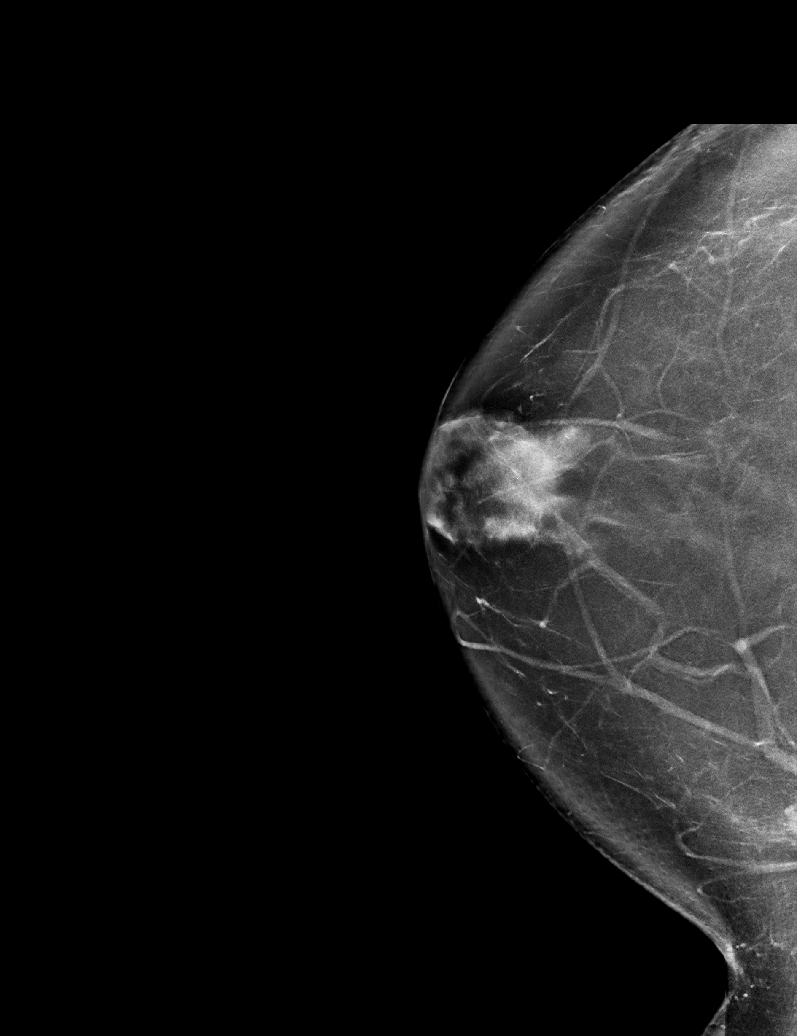

[L MLO synth-2D (2 of 2)]
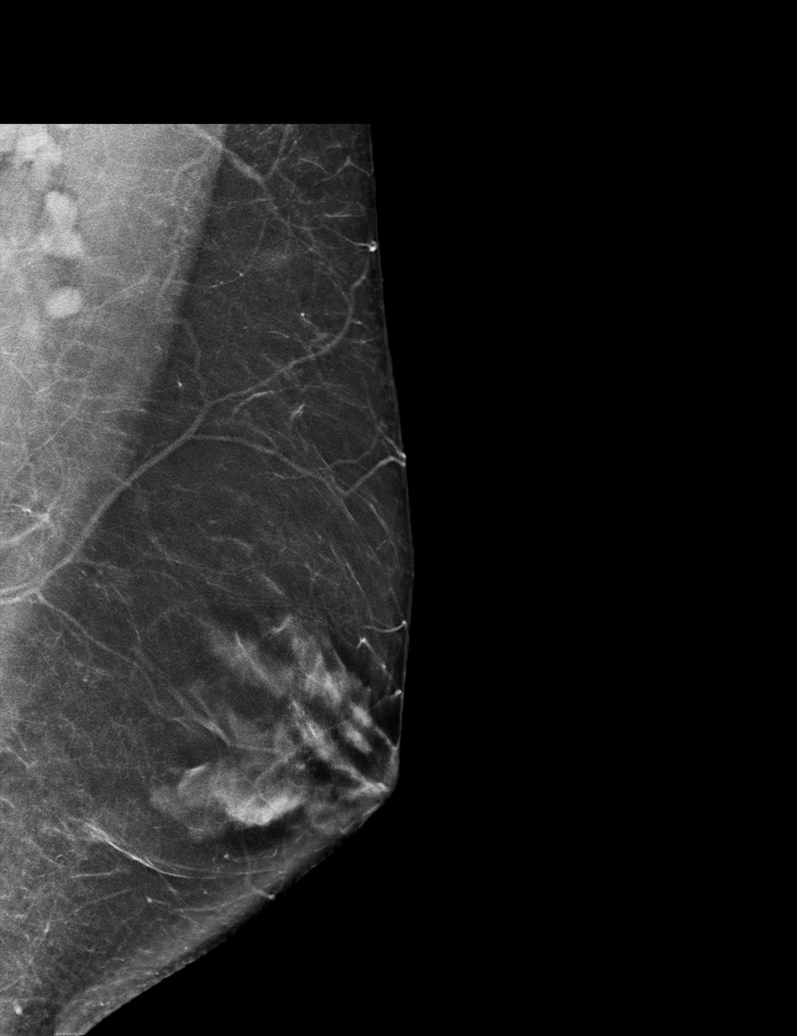

[R MLO synth-2D]
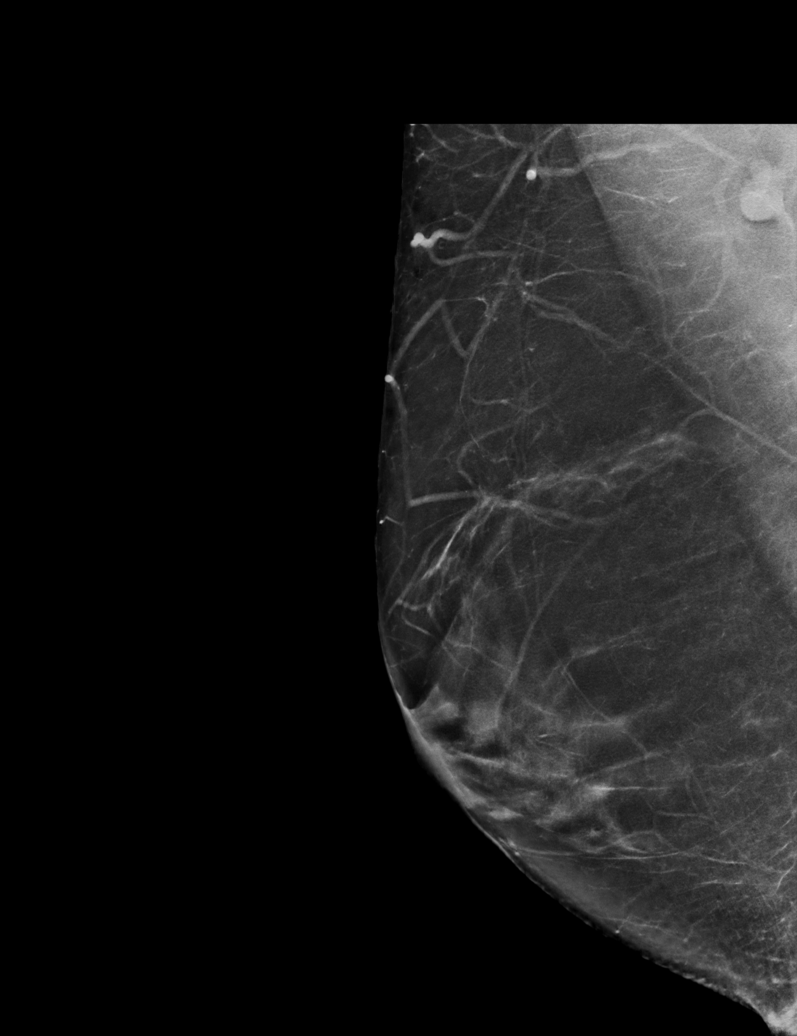

[L CC synth-2D (1 of 2)]
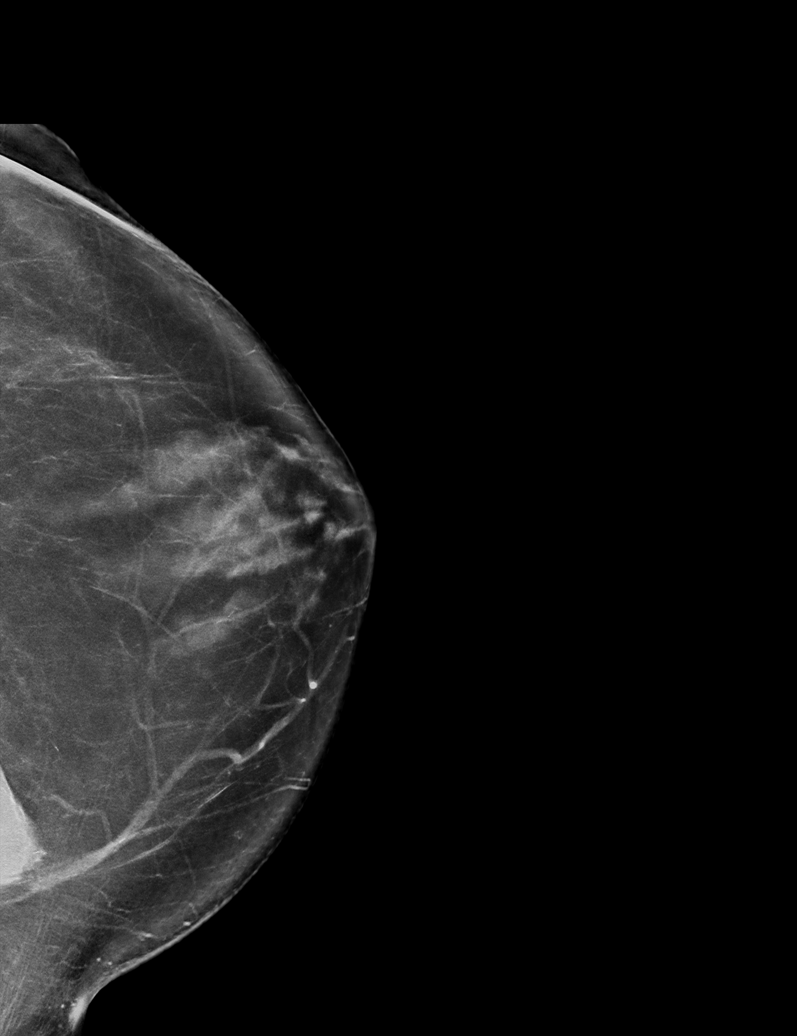

[L CC synth-2D (2 of 2)]
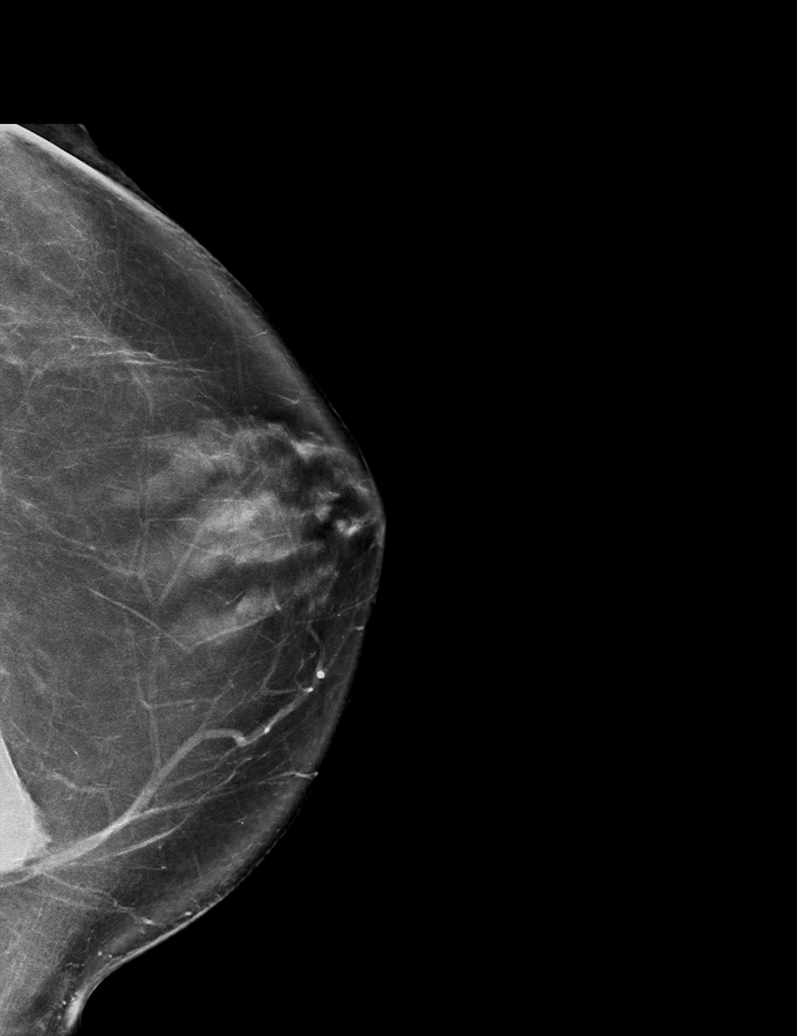

[6 of 36 positions shown; findings below may reference images not displayed]

ACR Breast Density Category b: There are scattered areas of
fibroglandular density.
FINDINGS: There are no findings suspicious for malignancy. Images were
processed with CAD.
IMPRESSION: No mammographic evidence of malignancy. A result letter of this
screening mammogram will be mailed directly to the patient.

RECOMMENDATION:
Screening mammogram in one year. (Code:[id number])

BI-RADS CATEGORY  1: Negative.

## 2021-04-15 DIAGNOSIS — F22 Delusional disorders: Secondary | ICD-10-CM | POA: Diagnosis not present

## 2021-05-27 DIAGNOSIS — L821 Other seborrheic keratosis: Secondary | ICD-10-CM | POA: Diagnosis not present

## 2021-05-27 DIAGNOSIS — L814 Other melanin hyperpigmentation: Secondary | ICD-10-CM | POA: Diagnosis not present

## 2021-05-27 DIAGNOSIS — L578 Other skin changes due to chronic exposure to nonionizing radiation: Secondary | ICD-10-CM | POA: Diagnosis not present

## 2021-05-27 DIAGNOSIS — D225 Melanocytic nevi of trunk: Secondary | ICD-10-CM | POA: Diagnosis not present

## 2021-06-30 DIAGNOSIS — F4323 Adjustment disorder with mixed anxiety and depressed mood: Secondary | ICD-10-CM | POA: Diagnosis not present

## 2021-09-03 DIAGNOSIS — Z1231 Encounter for screening mammogram for malignant neoplasm of breast: Secondary | ICD-10-CM | POA: Diagnosis not present

## 2021-10-10 DIAGNOSIS — F22 Delusional disorders: Secondary | ICD-10-CM | POA: Diagnosis not present

## 2021-10-10 DIAGNOSIS — F331 Major depressive disorder, recurrent, moderate: Secondary | ICD-10-CM | POA: Diagnosis not present

## 2021-10-10 DIAGNOSIS — F411 Generalized anxiety disorder: Secondary | ICD-10-CM | POA: Diagnosis not present

## 2021-10-23 DIAGNOSIS — F411 Generalized anxiety disorder: Secondary | ICD-10-CM | POA: Diagnosis not present

## 2021-11-11 DIAGNOSIS — F411 Generalized anxiety disorder: Secondary | ICD-10-CM | POA: Diagnosis not present

## 2021-11-20 DIAGNOSIS — F411 Generalized anxiety disorder: Secondary | ICD-10-CM | POA: Diagnosis not present

## 2021-11-25 DIAGNOSIS — F411 Generalized anxiety disorder: Secondary | ICD-10-CM | POA: Diagnosis not present

## 2021-12-11 DIAGNOSIS — F411 Generalized anxiety disorder: Secondary | ICD-10-CM | POA: Diagnosis not present

## 2021-12-11 DIAGNOSIS — F331 Major depressive disorder, recurrent, moderate: Secondary | ICD-10-CM | POA: Diagnosis not present

## 2021-12-11 DIAGNOSIS — F22 Delusional disorders: Secondary | ICD-10-CM | POA: Diagnosis not present

## 2021-12-16 DIAGNOSIS — F411 Generalized anxiety disorder: Secondary | ICD-10-CM | POA: Diagnosis not present

## 2021-12-30 DIAGNOSIS — E781 Pure hyperglyceridemia: Secondary | ICD-10-CM | POA: Diagnosis not present

## 2021-12-30 DIAGNOSIS — R7309 Other abnormal glucose: Secondary | ICD-10-CM | POA: Diagnosis not present

## 2021-12-30 DIAGNOSIS — F411 Generalized anxiety disorder: Secondary | ICD-10-CM | POA: Diagnosis not present

## 2021-12-31 DIAGNOSIS — Z0000 Encounter for general adult medical examination without abnormal findings: Secondary | ICD-10-CM | POA: Diagnosis not present

## 2022-01-13 DIAGNOSIS — F3342 Major depressive disorder, recurrent, in full remission: Secondary | ICD-10-CM | POA: Diagnosis not present

## 2022-01-13 DIAGNOSIS — F22 Delusional disorders: Secondary | ICD-10-CM | POA: Diagnosis not present

## 2022-01-13 DIAGNOSIS — F4322 Adjustment disorder with anxiety: Secondary | ICD-10-CM | POA: Diagnosis not present

## 2022-01-13 DIAGNOSIS — F411 Generalized anxiety disorder: Secondary | ICD-10-CM | POA: Diagnosis not present

## 2022-01-27 DIAGNOSIS — F411 Generalized anxiety disorder: Secondary | ICD-10-CM | POA: Diagnosis not present

## 2022-02-10 DIAGNOSIS — F3342 Major depressive disorder, recurrent, in full remission: Secondary | ICD-10-CM | POA: Diagnosis not present

## 2022-02-10 DIAGNOSIS — F22 Delusional disorders: Secondary | ICD-10-CM | POA: Diagnosis not present

## 2022-02-10 DIAGNOSIS — F411 Generalized anxiety disorder: Secondary | ICD-10-CM | POA: Diagnosis not present

## 2022-02-24 DIAGNOSIS — F411 Generalized anxiety disorder: Secondary | ICD-10-CM | POA: Diagnosis not present

## 2022-03-10 DIAGNOSIS — F411 Generalized anxiety disorder: Secondary | ICD-10-CM | POA: Diagnosis not present

## 2022-03-24 DIAGNOSIS — F411 Generalized anxiety disorder: Secondary | ICD-10-CM | POA: Diagnosis not present

## 2022-03-26 DIAGNOSIS — Q5128 Other doubling of uterus, other specified: Secondary | ICD-10-CM | POA: Diagnosis not present

## 2022-03-26 DIAGNOSIS — Z9889 Other specified postprocedural states: Secondary | ICD-10-CM | POA: Diagnosis not present

## 2022-03-26 DIAGNOSIS — Z1151 Encounter for screening for human papillomavirus (HPV): Secondary | ICD-10-CM | POA: Diagnosis not present

## 2022-03-26 DIAGNOSIS — N854 Malposition of uterus: Secondary | ICD-10-CM | POA: Diagnosis not present

## 2022-03-26 DIAGNOSIS — Z8742 Personal history of other diseases of the female genital tract: Secondary | ICD-10-CM | POA: Diagnosis not present

## 2022-03-26 DIAGNOSIS — Z124 Encounter for screening for malignant neoplasm of cervix: Secondary | ICD-10-CM | POA: Diagnosis not present

## 2022-03-26 DIAGNOSIS — D251 Intramural leiomyoma of uterus: Secondary | ICD-10-CM | POA: Diagnosis not present

## 2022-03-26 DIAGNOSIS — Z01419 Encounter for gynecological examination (general) (routine) without abnormal findings: Secondary | ICD-10-CM | POA: Diagnosis not present

## 2022-04-21 DIAGNOSIS — F411 Generalized anxiety disorder: Secondary | ICD-10-CM | POA: Diagnosis not present

## 2022-04-22 DIAGNOSIS — R9389 Abnormal findings on diagnostic imaging of other specified body structures: Secondary | ICD-10-CM | POA: Diagnosis not present

## 2022-04-22 DIAGNOSIS — Z9889 Other specified postprocedural states: Secondary | ICD-10-CM | POA: Diagnosis not present

## 2022-04-22 DIAGNOSIS — D251 Intramural leiomyoma of uterus: Secondary | ICD-10-CM | POA: Diagnosis not present

## 2022-04-22 DIAGNOSIS — Z3202 Encounter for pregnancy test, result negative: Secondary | ICD-10-CM | POA: Diagnosis not present

## 2022-04-22 DIAGNOSIS — Z86018 Personal history of other benign neoplasm: Secondary | ICD-10-CM | POA: Diagnosis not present

## 2022-05-27 DIAGNOSIS — F3342 Major depressive disorder, recurrent, in full remission: Secondary | ICD-10-CM | POA: Diagnosis not present

## 2022-05-27 DIAGNOSIS — F411 Generalized anxiety disorder: Secondary | ICD-10-CM | POA: Diagnosis not present

## 2022-05-27 DIAGNOSIS — F22 Delusional disorders: Secondary | ICD-10-CM | POA: Diagnosis not present

## 2022-06-15 DIAGNOSIS — L821 Other seborrheic keratosis: Secondary | ICD-10-CM | POA: Diagnosis not present

## 2022-06-15 DIAGNOSIS — D225 Melanocytic nevi of trunk: Secondary | ICD-10-CM | POA: Diagnosis not present

## 2022-06-15 DIAGNOSIS — L814 Other melanin hyperpigmentation: Secondary | ICD-10-CM | POA: Diagnosis not present

## 2022-06-15 DIAGNOSIS — L578 Other skin changes due to chronic exposure to nonionizing radiation: Secondary | ICD-10-CM | POA: Diagnosis not present

## 2022-06-16 DIAGNOSIS — F411 Generalized anxiety disorder: Secondary | ICD-10-CM | POA: Diagnosis not present

## 2022-06-30 DIAGNOSIS — F411 Generalized anxiety disorder: Secondary | ICD-10-CM | POA: Diagnosis not present

## 2022-07-14 DIAGNOSIS — F411 Generalized anxiety disorder: Secondary | ICD-10-CM | POA: Diagnosis not present

## 2022-07-28 DIAGNOSIS — F411 Generalized anxiety disorder: Secondary | ICD-10-CM | POA: Diagnosis not present

## 2022-08-11 DIAGNOSIS — F411 Generalized anxiety disorder: Secondary | ICD-10-CM | POA: Diagnosis not present

## 2022-09-09 DIAGNOSIS — R92323 Mammographic fibroglandular density, bilateral breasts: Secondary | ICD-10-CM | POA: Diagnosis not present

## 2022-09-09 DIAGNOSIS — Z1231 Encounter for screening mammogram for malignant neoplasm of breast: Secondary | ICD-10-CM | POA: Diagnosis not present

## 2022-09-22 DIAGNOSIS — F411 Generalized anxiety disorder: Secondary | ICD-10-CM | POA: Diagnosis not present

## 2022-10-06 DIAGNOSIS — F411 Generalized anxiety disorder: Secondary | ICD-10-CM | POA: Diagnosis not present

## 2022-10-20 DIAGNOSIS — F411 Generalized anxiety disorder: Secondary | ICD-10-CM | POA: Diagnosis not present

## 2022-11-17 DIAGNOSIS — F411 Generalized anxiety disorder: Secondary | ICD-10-CM | POA: Diagnosis not present

## 2022-12-01 DIAGNOSIS — F411 Generalized anxiety disorder: Secondary | ICD-10-CM | POA: Diagnosis not present

## 2022-12-15 DIAGNOSIS — F22 Delusional disorders: Secondary | ICD-10-CM | POA: Diagnosis not present

## 2022-12-15 DIAGNOSIS — F411 Generalized anxiety disorder: Secondary | ICD-10-CM | POA: Diagnosis not present

## 2023-01-04 DIAGNOSIS — F411 Generalized anxiety disorder: Secondary | ICD-10-CM | POA: Diagnosis not present

## 2023-01-05 DIAGNOSIS — R7301 Impaired fasting glucose: Secondary | ICD-10-CM | POA: Diagnosis not present

## 2023-01-05 DIAGNOSIS — E781 Pure hyperglyceridemia: Secondary | ICD-10-CM | POA: Diagnosis not present

## 2023-01-12 DIAGNOSIS — F411 Generalized anxiety disorder: Secondary | ICD-10-CM | POA: Diagnosis not present

## 2023-02-09 DIAGNOSIS — F411 Generalized anxiety disorder: Secondary | ICD-10-CM | POA: Diagnosis not present

## 2023-03-09 DIAGNOSIS — F411 Generalized anxiety disorder: Secondary | ICD-10-CM | POA: Diagnosis not present

## 2023-03-24 DIAGNOSIS — Z1159 Encounter for screening for other viral diseases: Secondary | ICD-10-CM | POA: Diagnosis not present

## 2023-03-24 DIAGNOSIS — Z0000 Encounter for general adult medical examination without abnormal findings: Secondary | ICD-10-CM | POA: Diagnosis not present

## 2023-04-06 DIAGNOSIS — F411 Generalized anxiety disorder: Secondary | ICD-10-CM | POA: Diagnosis not present

## 2023-04-20 DIAGNOSIS — F411 Generalized anxiety disorder: Secondary | ICD-10-CM | POA: Diagnosis not present

## 2023-04-26 DIAGNOSIS — F22 Delusional disorders: Secondary | ICD-10-CM | POA: Diagnosis not present

## 2023-04-26 DIAGNOSIS — F4322 Adjustment disorder with anxiety: Secondary | ICD-10-CM | POA: Diagnosis not present

## 2023-04-26 DIAGNOSIS — F411 Generalized anxiety disorder: Secondary | ICD-10-CM | POA: Diagnosis not present

## 2023-05-04 DIAGNOSIS — F411 Generalized anxiety disorder: Secondary | ICD-10-CM | POA: Diagnosis not present

## 2023-05-20 DIAGNOSIS — F411 Generalized anxiety disorder: Secondary | ICD-10-CM | POA: Diagnosis not present

## 2023-05-25 DIAGNOSIS — F411 Generalized anxiety disorder: Secondary | ICD-10-CM | POA: Diagnosis not present

## 2023-06-15 DIAGNOSIS — F411 Generalized anxiety disorder: Secondary | ICD-10-CM | POA: Diagnosis not present

## 2023-06-29 DIAGNOSIS — F411 Generalized anxiety disorder: Secondary | ICD-10-CM | POA: Diagnosis not present

## 2023-07-12 DIAGNOSIS — L821 Other seborrheic keratosis: Secondary | ICD-10-CM | POA: Diagnosis not present

## 2023-07-12 DIAGNOSIS — L814 Other melanin hyperpigmentation: Secondary | ICD-10-CM | POA: Diagnosis not present

## 2023-07-12 DIAGNOSIS — D225 Melanocytic nevi of trunk: Secondary | ICD-10-CM | POA: Diagnosis not present

## 2023-07-12 DIAGNOSIS — L578 Other skin changes due to chronic exposure to nonionizing radiation: Secondary | ICD-10-CM | POA: Diagnosis not present

## 2023-07-13 DIAGNOSIS — F411 Generalized anxiety disorder: Secondary | ICD-10-CM | POA: Diagnosis not present

## 2023-07-27 DIAGNOSIS — F411 Generalized anxiety disorder: Secondary | ICD-10-CM | POA: Diagnosis not present

## 2023-08-09 DIAGNOSIS — F411 Generalized anxiety disorder: Secondary | ICD-10-CM | POA: Diagnosis not present

## 2023-08-12 DIAGNOSIS — Z01411 Encounter for gynecological examination (general) (routine) with abnormal findings: Secondary | ICD-10-CM | POA: Diagnosis not present

## 2023-08-12 DIAGNOSIS — D219 Benign neoplasm of connective and other soft tissue, unspecified: Secondary | ICD-10-CM | POA: Diagnosis not present

## 2023-08-12 DIAGNOSIS — N764 Abscess of vulva: Secondary | ICD-10-CM | POA: Diagnosis not present

## 2023-08-12 DIAGNOSIS — Z86018 Personal history of other benign neoplasm: Secondary | ICD-10-CM | POA: Diagnosis not present

## 2023-08-16 DIAGNOSIS — F411 Generalized anxiety disorder: Secondary | ICD-10-CM | POA: Diagnosis not present

## 2023-08-24 DIAGNOSIS — F411 Generalized anxiety disorder: Secondary | ICD-10-CM | POA: Diagnosis not present

## 2023-09-07 DIAGNOSIS — F411 Generalized anxiety disorder: Secondary | ICD-10-CM | POA: Diagnosis not present

## 2023-09-14 DIAGNOSIS — Z1231 Encounter for screening mammogram for malignant neoplasm of breast: Secondary | ICD-10-CM | POA: Diagnosis not present

## 2023-09-14 DIAGNOSIS — R92323 Mammographic fibroglandular density, bilateral breasts: Secondary | ICD-10-CM | POA: Diagnosis not present

## 2023-09-21 DIAGNOSIS — F411 Generalized anxiety disorder: Secondary | ICD-10-CM | POA: Diagnosis not present

## 2023-09-23 DIAGNOSIS — F22 Delusional disorders: Secondary | ICD-10-CM | POA: Diagnosis not present

## 2023-09-23 DIAGNOSIS — F331 Major depressive disorder, recurrent, moderate: Secondary | ICD-10-CM | POA: Diagnosis not present

## 2023-09-23 DIAGNOSIS — F4321 Adjustment disorder with depressed mood: Secondary | ICD-10-CM | POA: Diagnosis not present

## 2023-09-23 DIAGNOSIS — F411 Generalized anxiety disorder: Secondary | ICD-10-CM | POA: Diagnosis not present

## 2023-09-28 DIAGNOSIS — F411 Generalized anxiety disorder: Secondary | ICD-10-CM | POA: Diagnosis not present

## 2023-10-05 DIAGNOSIS — F411 Generalized anxiety disorder: Secondary | ICD-10-CM | POA: Diagnosis not present

## 2023-10-14 DIAGNOSIS — F22 Delusional disorders: Secondary | ICD-10-CM | POA: Diagnosis not present

## 2023-10-14 DIAGNOSIS — F411 Generalized anxiety disorder: Secondary | ICD-10-CM | POA: Diagnosis not present

## 2023-10-19 DIAGNOSIS — F411 Generalized anxiety disorder: Secondary | ICD-10-CM | POA: Diagnosis not present

## 2023-11-09 DIAGNOSIS — F411 Generalized anxiety disorder: Secondary | ICD-10-CM | POA: Diagnosis not present

## 2023-11-16 DIAGNOSIS — F411 Generalized anxiety disorder: Secondary | ICD-10-CM | POA: Diagnosis not present

## 2023-11-23 DIAGNOSIS — F411 Generalized anxiety disorder: Secondary | ICD-10-CM | POA: Diagnosis not present

## 2023-12-06 DIAGNOSIS — F411 Generalized anxiety disorder: Secondary | ICD-10-CM | POA: Diagnosis not present

## 2023-12-14 DIAGNOSIS — F411 Generalized anxiety disorder: Secondary | ICD-10-CM | POA: Diagnosis not present

## 2023-12-20 DIAGNOSIS — F411 Generalized anxiety disorder: Secondary | ICD-10-CM | POA: Diagnosis not present

## 2023-12-24 DIAGNOSIS — F22 Delusional disorders: Secondary | ICD-10-CM | POA: Diagnosis not present

## 2023-12-24 DIAGNOSIS — F4321 Adjustment disorder with depressed mood: Secondary | ICD-10-CM | POA: Diagnosis not present

## 2023-12-24 DIAGNOSIS — F411 Generalized anxiety disorder: Secondary | ICD-10-CM | POA: Diagnosis not present

## 2023-12-28 DIAGNOSIS — F411 Generalized anxiety disorder: Secondary | ICD-10-CM | POA: Diagnosis not present

## 2024-01-03 DIAGNOSIS — F411 Generalized anxiety disorder: Secondary | ICD-10-CM | POA: Diagnosis not present

## 2024-01-11 DIAGNOSIS — F411 Generalized anxiety disorder: Secondary | ICD-10-CM | POA: Diagnosis not present

## 2024-01-13 DIAGNOSIS — F411 Generalized anxiety disorder: Secondary | ICD-10-CM | POA: Diagnosis not present

## 2024-01-27 ENCOUNTER — Ambulatory Visit (INDEPENDENT_AMBULATORY_CARE_PROVIDER_SITE_OTHER): Admitting: Podiatry

## 2024-01-27 ENCOUNTER — Ambulatory Visit (INDEPENDENT_AMBULATORY_CARE_PROVIDER_SITE_OTHER)

## 2024-01-27 DIAGNOSIS — M21961 Unspecified acquired deformity of right lower leg: Secondary | ICD-10-CM

## 2024-01-27 DIAGNOSIS — M2010 Hallux valgus (acquired), unspecified foot: Secondary | ICD-10-CM

## 2024-01-27 DIAGNOSIS — M2041 Other hammer toe(s) (acquired), right foot: Secondary | ICD-10-CM

## 2024-01-27 DIAGNOSIS — M2011 Hallux valgus (acquired), right foot: Secondary | ICD-10-CM

## 2024-01-27 DIAGNOSIS — M2012 Hallux valgus (acquired), left foot: Secondary | ICD-10-CM | POA: Diagnosis not present

## 2024-01-27 NOTE — Progress Notes (Signed)
  Subjective:  Patient ID: Claudia Berger, female    DOB: [birth year],  MRN: [id number]  Chief Complaint  Patient presents with   Bunions    48 y.o. female presents with the above complaint. History confirmed with patient.  She is referred to me Dr. Verta for a painful bunion of the right foot she was previously seen several years ago and surgical intervention was recommended at the time she was not interested in proceeding with this and she returns today for follow-up it has become worse and is more painful.  Wider shoes and padding has not helped and offloading the issue enough to reduce pain.  Objective:  Physical Exam: warm, good capillary refill, no trophic changes or ulcerative lesions, normal DP and PT pulses, normal sensory exam, and right foot she has hallux valgus deformity with hypermobility of the first tarsometatarsal joint and a large painful bunion medially as well as a near crossover second toe that is semireducible.   Radiographs: Multiple views x-ray of both feet: Radiographs taken today of both feet show hallux valgus deformity worse on the right and left in severity and second digital contracture and elongated second metatarsal Assessment:   1. Acquired hallux valgus, unspecified laterality   2. Metatarsal deformity, right   3. Hammertoe of second toe of right foot      Plan:  Patient was evaluated and treated and all questions answered.  Discussed the etiology and treatment including surgical and non surgical treatment for painful bunions and hammertoes. She  has exhausted all non surgical treatment prior to this visit including shoe gear changes and padding. She desires surgical intervention. We discussed all risks including but not limited to: pain, swelling, infection, scar, numbness which may be temporary or permanent, chronic pain, stiffness, nerve pain or damage, wound healing problems, bone healing problems including delayed or non-union and recurrence.  Specifically we discussed the following procedures: Lapidus bunionectomy, possible Akin osteotomy and Weil osteotomy and second hammertoe correction with internal fixation. Informed consent was signed today. Surgery will be scheduled at a mutually agreeable date. Information regarding this will be forwarded to our surgery scheduler. In the interim until surgery I recommended utilizing as wide of shoes as possible, take NSAIDs or tylenol  as tolerated for pain, and a bunion padding shield which can be purchased online.   Surgical plan:  Procedure: - Right foot Lapidus bunionectomy, possible Akin osteotomy and Weil osteotomy and second hammertoe correction with internal fixation  Location: - [facility]  Anesthesia plan: - Sedation with regional block  Postoperative pain plan: - Tylenol  1000 mg every 6 hours, ibuprofen  600 mg every 6 hours, gabapentin 300 mg every 8 hours x5 days, oxycodone  5 mg 1-2 tabs every 6 hours only as needed  DVT prophylaxis: - None required  WB Restrictions / DME needs: - Nonweightbearing CAM boot postop  No follow-ups on file.

## 2024-01-31 DIAGNOSIS — F411 Generalized anxiety disorder: Secondary | ICD-10-CM | POA: Diagnosis not present

## 2024-02-02 ENCOUNTER — Telehealth: Payer: Self-pay | Admitting: Podiatry

## 2024-02-02 NOTE — Telephone Encounter (Signed)
 Received surgical consent form   Left message for pt to call to schedule the surgery.

## 2024-02-08 DIAGNOSIS — F411 Generalized anxiety disorder: Secondary | ICD-10-CM | POA: Diagnosis not present

## 2024-02-11 DIAGNOSIS — F411 Generalized anxiety disorder: Secondary | ICD-10-CM | POA: Diagnosis not present

## 2024-02-11 DIAGNOSIS — F22 Delusional disorders: Secondary | ICD-10-CM | POA: Diagnosis not present

## 2024-02-11 DIAGNOSIS — F4321 Adjustment disorder with depressed mood: Secondary | ICD-10-CM | POA: Diagnosis not present

## 2024-03-07 DIAGNOSIS — F411 Generalized anxiety disorder: Secondary | ICD-10-CM | POA: Diagnosis not present

## 2024-03-21 DIAGNOSIS — F411 Generalized anxiety disorder: Secondary | ICD-10-CM | POA: Diagnosis not present

## 2024-03-22 DIAGNOSIS — Z131 Encounter for screening for diabetes mellitus: Secondary | ICD-10-CM | POA: Diagnosis not present

## 2024-03-22 DIAGNOSIS — Z79899 Other long term (current) drug therapy: Secondary | ICD-10-CM | POA: Diagnosis not present

## 2024-03-29 DIAGNOSIS — Z0000 Encounter for general adult medical examination without abnormal findings: Secondary | ICD-10-CM | POA: Diagnosis not present

## 2024-03-29 DIAGNOSIS — B360 Pityriasis versicolor: Secondary | ICD-10-CM | POA: Diagnosis not present

## 2024-03-29 DIAGNOSIS — F319 Bipolar disorder, unspecified: Secondary | ICD-10-CM | POA: Diagnosis not present

## 2024-03-29 DIAGNOSIS — L659 Nonscarring hair loss, unspecified: Secondary | ICD-10-CM | POA: Diagnosis not present

## 2024-04-04 DIAGNOSIS — F411 Generalized anxiety disorder: Secondary | ICD-10-CM | POA: Diagnosis not present

## 2024-04-07 DIAGNOSIS — L659 Nonscarring hair loss, unspecified: Secondary | ICD-10-CM | POA: Diagnosis not present

## 2024-04-18 DIAGNOSIS — F411 Generalized anxiety disorder: Secondary | ICD-10-CM | POA: Diagnosis not present

## 2024-04-24 DIAGNOSIS — F411 Generalized anxiety disorder: Secondary | ICD-10-CM | POA: Diagnosis not present

## 2024-05-03 DIAGNOSIS — F411 Generalized anxiety disorder: Secondary | ICD-10-CM | POA: Diagnosis not present

## 2024-05-16 DIAGNOSIS — F411 Generalized anxiety disorder: Secondary | ICD-10-CM | POA: Diagnosis not present
